# Patient Record
Sex: Female | Born: 1970 | Race: Black or African American | Hispanic: No | Smoking: Current every day smoker
Health system: Southern US, Community
[De-identification: ages and names within clinical notes are randomized; demographics above are authoritative.]

## PROBLEM LIST (undated history)

## (undated) HISTORY — PX: TUBAL LIGATION: SHX77

## (undated) HISTORY — PX: KNEE ARTHROSCOPY: SUR90

---

## 2009-05-22 HISTORY — PX: BREAST EXCISIONAL BIOPSY: SUR124

## 2011-03-15 DIAGNOSIS — Z6841 Body Mass Index (BMI) 40.0 and over, adult: Secondary | ICD-10-CM | POA: Insufficient documentation

## 2017-04-19 DIAGNOSIS — I10 Essential (primary) hypertension: Secondary | ICD-10-CM | POA: Insufficient documentation

## 2018-12-09 ENCOUNTER — Encounter (HOSPITAL_COMMUNITY): Payer: Self-pay | Admitting: Emergency Medicine

## 2018-12-09 ENCOUNTER — Other Ambulatory Visit: Payer: Self-pay

## 2018-12-09 ENCOUNTER — Emergency Department (HOSPITAL_COMMUNITY)
Admission: EM | Admit: 2018-12-09 | Discharge: 2018-12-09 | Disposition: A | Discharge status: Home / Self Care | Payer: Self-pay | Attending: Emergency Medicine | Admitting: Emergency Medicine

## 2018-12-09 DIAGNOSIS — L03213 Periorbital cellulitis: Secondary | ICD-10-CM

## 2018-12-09 DIAGNOSIS — H5789 Other specified disorders of eye and adnexa: Secondary | ICD-10-CM

## 2018-12-09 DIAGNOSIS — Z3202 Encounter for pregnancy test, result negative: Secondary | ICD-10-CM | POA: Insufficient documentation

## 2018-12-09 LAB — POC URINE PREG, ED: Preg Test, Ur: NEGATIVE

## 2018-12-09 MED ORDER — DOXYCYCLINE HYCLATE 100 MG PO CAPS: 100.0000 mg | ORAL_CAPSULE | Freq: Two times a day (BID) | ORAL | 0 refills | Status: AC

## 2018-12-09 MED ORDER — DOXYCYCLINE HYCLATE 100 MG PO TABS
100.0000 mg | ORAL_TABLET | Freq: Once | ORAL | Status: AC
  Administered 2018-12-09: 100 mg via ORAL
  Filled 2018-12-09: qty 1

## 2018-12-09 NOTE — Discharge Instructions (Addendum)
Please see the information and instructions below regarding your visit.  Your diagnoses today include:  1. Cyst of eye   2. Periorbital cellulitis of right eye     Tests performed today include: See side panel of your discharge paperwork for testing performed today. Vital signs are listed at the bottom of these instructions.   Medications prescribed:    Take any prescribed medications only as prescribed, and any over the counter medications only as directed on the packaging.  Doxycycline is an antibiotic that fights infection in the skin. This medication can make your skin sensitive to the sun, so please ensure that you wear sunscreen, hats, or other coverage over your skin while taking this. This medicine CANNOT be taken by women while pregnant, breastfeeding, or trying to become pregnant.  Please speak with a healthcare provider if any of these situations apply to you.  Home care instructions:  Please follow any educational materials contained in this packet.   Follow-up instructions: Please follow-up with Dr. Katy Fitch on Wednesday of this week.   Return instructions:  Please return to the Emergency Department if you experience worsening symptoms.  Come back to the emergency department for any pain behind the eye, inability to move the eye, fevers or worsening swelling or pain. Please return if you have any other emergent concerns.  Additional Information:   Your vital signs today were: BP 128/78 (BP Location: Right Wrist)    Pulse 91    Temp 99 F (37.2 C) (Oral)    Resp 16    Ht 5\' 4"  (1.626 m)    Wt 110.7 kg    SpO2 96%    BMI 41.88 kg/m  If your blood pressure (BP) was elevated on multiple readings during this visit above 130 for the top number or above 80 for the bottom number, please have this repeated by your primary care provider within one month. --------------  Thank you for allowing [place] to participate in your care today.

## 2018-12-09 NOTE — ED Provider Notes (Signed)
[facility] Provider Note   CSN: [id number] Arrival date & time: 12/09/18  [id number]     History   Chief Complaint Chief Complaint  Patient presents with  . Abscess    HPI Kristen Cummings is a 48 y.o. female.     HPI  Patient is a 48 yo female with no significant PMH presenting for erythema and possible abscess of right eye. Patient reports approximately 5 month history of lesion superior to the right lacrimal apparatus. Patient reports that over the past one week she has had increasing swelling of this lesion. No active drainage of the site. She reports increasing erythema of the right upper eyelid. She denies pain with extraocular motion, retro-orbital pain, fever, or chills. She denies remedies for symptoms.   History reviewed. No pertinent past medical history.  There are no active problems to display for this patient.    OB History   No obstetric history on file.      Home Medications    Prior to Admission medications   Not on File    Family History History reviewed. No pertinent family history.  Social History Social History   Tobacco Use  . Smoking status: Not on file  Substance Use Topics  . Alcohol use: Not on file  . Drug use: Not on file     Allergies   Patient has no known allergies.   Review of Systems Review of Systems  Constitutional: Negative for chills and fever.  HENT: Negative for congestion and rhinorrhea.   Eyes: Negative for photophobia, pain, discharge, redness, itching and visual disturbance.       +Periorbital swelling. +Cyst      Physical Exam Updated Vital Signs BP 128/78 (BP Location: Right Wrist)   Pulse 91   Temp 99 F (37.2 C) (Oral)   Resp 16   Ht 5\' 4"  (1.626 m)   Wt 110.7 kg   SpO2 96%   BMI 41.88 kg/m   Physical Exam Vitals signs and nursing note reviewed.  Constitutional:      General: She is not in acute distress.    Appearance: She is well-developed. She is  not diaphoretic.     Comments: Sitting comfortably in bed.  HENT:     Head: Normocephalic and atraumatic.  Eyes:     General:        Right eye: No discharge.        Left eye: No discharge.     Extraocular Movements: Extraocular movements intact.     Conjunctiva/sclera: Conjunctivae normal.     Pupils: Pupils are equal, round, and reactive to light.     Comments: Right eye exam: No proptosis. No pain with extraocular motions.  Mild supraorbital tissue swelling. See clinical photo for details. There is a firm, slightly erythematous cyst superior to lacrimal apparatus with no fluctuance or drainage.   Neck:     Musculoskeletal: Normal range of motion.  Cardiovascular:     Rate and Rhythm: Normal rate and regular rhythm.     Comments: Intact, 2+ radial pulse. Pulmonary:     Comments: Converses comfortably. No audible wheeze or stridor.  Abdominal:     General: There is no distension.  Musculoskeletal: Normal range of motion.  Skin:    General: Skin is warm and dry.  Neurological:     Mental Status: She is alert.     Comments: Cranial nerves intact to gross observation. Patient moves extremities without difficulty.  Psychiatric:  Behavior: Behavior normal.        Thought Content: Thought content normal.        Judgment: Judgment normal.        ED Treatments / Results  Labs (all labs ordered are listed, but only abnormal results are displayed) Labs Reviewed - No data to display  EKG None  Radiology No results found.  Procedures Procedures (including critical care time)  Medications Ordered in ED Medications - No data to display   Initial Impression / Assessment and Plan / ED Course  I have reviewed the triage vital signs and the nursing notes.  Pertinent labs & imaging results that were available during my care of the patient were reviewed by me and considered in my medical decision making (see chart for details).        This is a well appearing 48 yo  female with no active medical problems presenting for lesion and swelling of right periorbital region. Appears that patient may have a sebaceous cyst that has become infected. Not clearly an abscess. No evidence of proptosis, pain with EOMs, or signs or symptoms of post-septal cellulitis. Case discussed with Dr. Katy Fitch of ophthalmology who recommends antibiotics and follow up with him in 48 hours. States that is does not appear to require I & D. Recommends doxycycline. Appreciate his involvement. Patient is given return precautions for any increasing swelling, erythema, pain with EOMs, or fever or chills. Patient is in understanding and agrees with the plan of care.  Final Clinical Impressions(s) / ED Diagnoses   Final diagnoses:  Cyst of eye  Periorbital cellulitis of right eye    ED Discharge Orders         Ordered    doxycycline (VIBRAMYCIN) 100 MG capsule  2 times daily     12/09/18 1245           Albesa Seen, PA-C 12/09/18 [place], [place], DO 12/09/18 1839

## 2018-12-09 NOTE — ED Triage Notes (Signed)
Pt arrives to ED from home with complaints of an abscess above her right eye. Pt states it has been there for a couple of months but has became infected over the last two weeks.

## 2018-12-09 NOTE — ED Notes (Signed)
Patient verbalizes understanding of discharge instructions. Opportunity for questioning and answers were provided. Armband removed by staff, pt discharged from ED.  

## 2019-01-10 ENCOUNTER — Other Ambulatory Visit: Payer: Self-pay | Admitting: *Deleted

## 2019-01-10 DIAGNOSIS — Z20822 Contact with and (suspected) exposure to covid-19: Secondary | ICD-10-CM

## 2019-01-11 LAB — NOVEL CORONAVIRUS, NAA: SARS-CoV-2, NAA: NOT DETECTED

## 2019-05-13 ENCOUNTER — Other Ambulatory Visit: Payer: Self-pay

## 2019-06-20 ENCOUNTER — Ambulatory Visit: Payer: Self-pay | Attending: Internal Medicine

## 2019-06-20 DIAGNOSIS — U071 COVID-19: Secondary | ICD-10-CM | POA: Insufficient documentation

## 2019-06-20 DIAGNOSIS — Z20822 Contact with and (suspected) exposure to covid-19: Secondary | ICD-10-CM

## 2019-06-21 LAB — NOVEL CORONAVIRUS, NAA: SARS-CoV-2, NAA: DETECTED — AB

## 2019-06-22 ENCOUNTER — Telehealth: Payer: Self-pay | Admitting: Infectious Diseases

## 2019-06-22 ENCOUNTER — Encounter: Payer: Self-pay | Admitting: Infectious Diseases

## 2019-06-22 ENCOUNTER — Other Ambulatory Visit: Payer: Self-pay | Admitting: Infectious Diseases

## 2019-06-22 DIAGNOSIS — Z6841 Body Mass Index (BMI) 40.0 and over, adult: Secondary | ICD-10-CM

## 2019-06-22 DIAGNOSIS — I10 Essential (primary) hypertension: Secondary | ICD-10-CM

## 2019-06-22 DIAGNOSIS — U071 COVID-19: Secondary | ICD-10-CM

## 2019-06-22 NOTE — Progress Notes (Signed)
  I connected by phone with Kristen Cummings on 06/22/2019 at 11:01 AM to discuss the potential use of an new treatment for mild to moderate COVID-19 viral infection in non-hospitalized patients.  This patient is a 49 y.o. female that meets the FDA criteria for Emergency Use Authorization of bamlanivimab or casirivimab\imdevimab.  Has a (+) direct SARS-CoV-2 viral test result  Has mild or moderate COVID-19   Is ? 51 years of age and weighs ? 40 kg  Is NOT hospitalized due to COVID-19  Is NOT requiring oxygen therapy or requiring an increase in baseline oxygen flow rate due to COVID-19  Is within 10 days of symptom onset  Has at least one of the high risk factor(s) for progression to severe COVID-19 and/or hospitalization as defined in EUA.  Specific high risk criteria : BMI >/= 35   I have spoken and communicated the following to the patient or parent/caregiver:  1. FDA has authorized the emergency use of bamlanivimab and casirivimab\imdevimab for the treatment of mild to moderate COVID-19 in adults and pediatric patients with positive results of direct SARS-CoV-2 viral testing who are 59 years of age and older weighing at least 40 kg, and who are at high risk for progressing to severe COVID-19 and/or hospitalization.  2. The significant known and potential risks and benefits of bamlanivimab and casirivimab\imdevimab, and the extent to which such potential risks and benefits are unknown.  3. Information on available alternative treatments and the risks and benefits of those alternatives, including clinical trials.  4. Patients treated with bamlanivimab and casirivimab\imdevimab should continue to self-isolate and use infection control measures (e.g., wear mask, isolate, social distance, avoid sharing personal items, clean and disinfect "high touch" surfaces, and frequent handwashing) according to CDC guidelines.   5. The patient or parent/caregiver has the option to accept or refuse  bamlanivimab or casirivimab\imdevimab .  After reviewing this information with the patient, The patient agreed to proceed with receiving the casirivimab\imdevimab infusion and will be provided a copy of the Fact sheet prior to receiving the infusion.[name] 06/22/2019 11:01 AM

## 2019-06-22 NOTE — Telephone Encounter (Signed)
Called to discuss with patient about Covid symptoms and the use of bamlanivimab, a monoclonal antibody infusion for those with mild to moderate Covid symptoms and at a high risk of hospitalization.  Pt is qualified for this infusion at the [facility] infusion center due to BMI>35.    She is having some dizziness and loopy at times with some headaches. She feels the cough is coming more from her chest. Also having some fatigue. She has been using Mucinex regularly. She takes naproxen for her knees daily. Asking about ASA due to clotting problems she has heard about after COVID. Informed it is not well defined as to who should take this and if so for how long in the outpatient setting. Would encourage her that if she decides to take this she use enteric coated and take with food.   She started feeling poorly Saturday 1/23 when she started feeling poorly. Tested on Monday 1/25 (send out test) that was negative. Lost smell on Thursday and Friday and re-tested positive on 1/29.   She is treated for HTN, knee arthritis, hyperlipidemia, vitamin D. She also has a BMI of 41.   Scheduled Tuesday 2/1 on day sx day 10. She has received the 1st COVID vaccine and scheduled to get the second 2/5 - discussed recommendations to delay 90d after mAb infusion.

## 2019-06-24 ENCOUNTER — Ambulatory Visit (HOSPITAL_COMMUNITY)
Admission: RE | Admit: 2019-06-24 | Discharge: 2019-06-24 | Disposition: A | Discharge status: Home / Self Care | Payer: HRSA Program | Source: Ambulatory Visit | Attending: Pulmonary Disease | Admitting: Pulmonary Disease

## 2019-06-24 DIAGNOSIS — Z6841 Body Mass Index (BMI) 40.0 and over, adult: Secondary | ICD-10-CM | POA: Diagnosis not present

## 2019-06-24 DIAGNOSIS — U071 COVID-19: Secondary | ICD-10-CM | POA: Insufficient documentation

## 2019-06-24 MED ORDER — EPINEPHRINE 0.3 MG/0.3ML IJ SOAJ: 0.3000 mg | Freq: Once | INTRAMUSCULAR | Status: DC | PRN

## 2019-06-24 MED ORDER — SODIUM CHLORIDE 0.9 % IV SOLN
Freq: Once | INTRAVENOUS | Status: AC
  Filled 2019-06-24: qty 10

## 2019-06-24 MED ORDER — METHYLPREDNISOLONE SODIUM SUCC 125 MG IJ SOLR: 125.0000 mg | Freq: Once | INTRAMUSCULAR | Status: DC | PRN

## 2019-06-24 MED ORDER — DIPHENHYDRAMINE HCL 50 MG/ML IJ SOLN: 50.0000 mg | Freq: Once | INTRAMUSCULAR | Status: DC | PRN

## 2019-06-24 MED ORDER — FAMOTIDINE IN NACL 20-0.9 MG/50ML-% IV SOLN: 20.0000 mg | Freq: Once | INTRAVENOUS | Status: DC | PRN

## 2019-06-24 MED ORDER — ALBUTEROL SULFATE HFA 108 (90 BASE) MCG/ACT IN AERS: 2.0000 | INHALATION_SPRAY | Freq: Once | RESPIRATORY_TRACT | Status: DC | PRN

## 2019-06-24 MED ORDER — SODIUM CHLORIDE 0.9 % IV SOLN: INTRAVENOUS | Status: DC | PRN

## 2019-06-24 NOTE — Progress Notes (Signed)
  Diagnosis: COVID-19  Physician: Dr. Wright  Procedure: Covid Infusion Clinic Med: casirivimab\imdevimab infusion - Provided patient with casirivimab\imdevimab fact sheet for patients, parents and caregivers prior to infusion.  Complications: No immediate complications noted.  Discharge: Discharged home   Kristen Cummings 06/24/2019    

## 2019-06-24 NOTE — Discharge Instructions (Signed)

## 2020-03-29 ENCOUNTER — Other Ambulatory Visit: Payer: Self-pay

## 2020-09-23 ENCOUNTER — Encounter: Payer: Self-pay | Admitting: Internal Medicine

## 2020-09-23 ENCOUNTER — Ambulatory Visit: Payer: 59 | Attending: Internal Medicine | Admitting: Internal Medicine

## 2020-09-23 ENCOUNTER — Other Ambulatory Visit: Payer: Self-pay

## 2020-09-23 VITALS — BP 118/77 | HR 98 | Resp 16 | Ht 63.5 in | Wt 256.0 lb

## 2020-09-23 DIAGNOSIS — E785 Hyperlipidemia, unspecified: Secondary | ICD-10-CM

## 2020-09-23 DIAGNOSIS — N950 Postmenopausal bleeding: Secondary | ICD-10-CM

## 2020-09-23 DIAGNOSIS — L659 Nonscarring hair loss, unspecified: Secondary | ICD-10-CM

## 2020-09-23 DIAGNOSIS — F17200 Nicotine dependence, unspecified, uncomplicated: Secondary | ICD-10-CM | POA: Insufficient documentation

## 2020-09-23 DIAGNOSIS — M79671 Pain in right foot: Secondary | ICD-10-CM | POA: Diagnosis not present

## 2020-09-23 DIAGNOSIS — I10 Essential (primary) hypertension: Secondary | ICD-10-CM

## 2020-09-23 DIAGNOSIS — Z1231 Encounter for screening mammogram for malignant neoplasm of breast: Secondary | ICD-10-CM

## 2020-09-23 DIAGNOSIS — M170 Bilateral primary osteoarthritis of knee: Secondary | ICD-10-CM | POA: Diagnosis not present

## 2020-09-23 DIAGNOSIS — F321 Major depressive disorder, single episode, moderate: Secondary | ICD-10-CM | POA: Diagnosis not present

## 2020-09-23 MED ORDER — NAPROXEN 500 MG PO TABS: 500.0000 mg | ORAL_TABLET | Freq: Two times a day (BID) | ORAL | 2 refills | Status: DC

## 2020-09-23 MED ORDER — CYCLOBENZAPRINE HCL 10 MG PO TABS: 10.0000 mg | ORAL_TABLET | Freq: Two times a day (BID) | ORAL | 2 refills | Status: DC | PRN

## 2020-09-23 MED ORDER — ROSUVASTATIN CALCIUM 40 MG PO TABS: 40.0000 mg | ORAL_TABLET | Freq: Every day | ORAL | 11 refills | Status: DC

## 2020-09-23 MED ORDER — LISINOPRIL-HYDROCHLOROTHIAZIDE 20-25 MG PO TABS: 1.0000 | ORAL_TABLET | Freq: Every day | ORAL | 3 refills | Status: DC

## 2020-09-23 MED ORDER — AMLODIPINE BESYLATE 10 MG PO TABS: 10.0000 mg | ORAL_TABLET | Freq: Every day | ORAL | 11 refills | Status: DC

## 2020-09-23 MED ORDER — DULOXETINE HCL 60 MG PO CPEP: 60.0000 mg | ORAL_CAPSULE | Freq: Every day | ORAL | 5 refills | Status: DC

## 2020-09-23 NOTE — Progress Notes (Signed)
Patient ID: Kristen Cummings, female    DOB: March 06, 1971  MRN: [id number]  CC: New Patient (Initial Visit)   Subjective: Chealsea Cummings is a 50 y.o. female who presents for new pt visit Her concerns today include:  Hx of HTN, HL, tob dep, obesity, preDM, OA knees (on Naprosyn and Cymbalta), insomnia  Patient presents for new patient visit.  Previous PCP was at [facility] in [place].  She confirms medical history of HTN, HL, tobacco dependence, obesity, OA of the knees and insomnia.  She is requesting refills on her medications.  OA knees: has advanced arthritis in both knees.   -Saw an orthopedics in the past and was told that she was too young to have knee replacement surgery.   -had arthroscopic surgery on the left knee in 2014.   -ambulates with a cane.  - Has chronic pain for which she takes Cymbalta and Naprosyn. Helpful.  Brings form with her today for renewal of handicap sticker. -Requests refill on cyclobenzaprine which she takes for back spasms.  -She is obese.  She has seen nutritionist in the past and tries to work on her eating habits.  Not able to move as much as she would like due to chronic pain in the knees from arthritis -Thinks she may have a thyroid issue because weight fluctuates between 230 and 250s, fatigue and hair loss.  Started taking an over-the-counter thyroid supplement.  Has bottle with her today.  Ingredients in the supplement contains several of the B vitamins, magnesium, zinc, selenium and copper.  Increased energy since taking this supplement.  Told by previous primary physician when she lived in [place] that thyroid level was borderline.  HTN: On lisinopril/hydrochlorothiazide and Norvasc.  Compliant with medications.  Blood pressure stable.  Limit salt in food.  HL: She is on Crestor 40 mg daily.  Right heel pain: X3 weeks.  Thinks that she has plantar fasciitis which she had in the left heel before.  Reports  swelling around the posterior heel and pain on plantar heel that is worse when she first wakes up in the mornings.  She has been icing and massaging the heel.  Complains of feeling very depressed for several months.  Contributing factors are limited mobility and chronic pain due to OA of the knees.  Requests referral to therapist.  Has had suicidal thoughts but none in the last 2 weeks.  No current plans in place to hurt herself.  Had suicide attempt with overdose at the age of 48.  tob dep: Smoked for 19 years.  Trying to quit.  Currently at 4 cigarettes a day.  Quit in the past the longest of which was 3 months.  Has tried nicotine patches and gum which caused worsening insomnia.  Chantix caused bad dreams.    Wants to know whether she is menopausal or not for her age.  Regular menses stopped in 2017.  However about once or twice a year she has spotting that can last about half a day.  Saw GYN at [facility] for postmenopausal bleeding 05/2019.  Negative EMB.  Pelvic ultrasound revealed fibroids.  -Currently in a study through [facility] where she is screened for STDs about every 6 months.  HM: Last MMG was 1 yr ago.  Last Pap +HPV 04/03/2019 at [facility] negative.   Past medical, social, family history and surgical history reviewed. Patient Active Problem List   Diagnosis Date Noted  . Benign essential hypertension 04/19/2017  . BMI  40.0-44.9, adult (HCC) 03/15/2011     Current Outpatient Medications on File Prior to Visit  Medication Sig Dispense Refill  . amLODipine (NORVASC) 10 MG tablet Take 10 mg by mouth daily.    . Cholecalciferol (VITAMIN D3) 25 MCG (1000 UT) CAPS Take by mouth.    . cyclobenzaprine (FLEXERIL) 10 MG tablet Take 10 mg by mouth 3 (three) times daily as needed for muscle spasms.    . DULoxetine (CYMBALTA) 60 MG capsule Take 60 mg by mouth daily.    Marland Kitchen lisinopril-hydrochlorothiazide (ZESTORETIC) 20-25 MG tablet Take 1 tablet by mouth daily.    . Melatonin 10 MG TABS Take by mouth.    .  naproxen (NAPROSYN) 500 MG tablet Take 500 mg by mouth 2 (two) times daily with a meal.    . rosuvastatin (CRESTOR) 40 MG tablet Take 40 mg by mouth daily.     No current facility-administered medications on file prior to visit.    No Known Allergies  Social History   Socioeconomic History  . Marital status: Divorced    Spouse name: Not on file  . Number of children: 3  . Years of education: Not on file  . Highest education level: Associate degree: academic program  Occupational History  . Occupation: Safe transport  Tobacco Use  . Smoking status: Current Every Day Smoker    Years: 19.00    Types: Cigarettes  . Smokeless tobacco: Never Used  Vaping Use  . Vaping Use: Never used  Substance and Sexual Activity  . Alcohol use: Yes    Comment: once every 2 wks  . Drug use: Never  . Sexual activity: Not on file    Comment: tubal  Other Topics Concern  . Not on file  Social History Narrative  . Not on file   Social Determinants of Health   Financial Resource Strain: Not on file  Food Insecurity: Not on file  Transportation Needs: Not on file  Physical Activity: Not on file  Stress: Not on file  Social Connections: Not on file  Intimate Partner Violence: Not on file    Family History  Problem Relation Age of Onset  . Heart disease Father   . Breast cancer Sister   . Breast cancer Paternal Aunt   . Breast cancer Paternal Grandmother     ROS: Review of Systems Negative except as stated above  PHYSICAL EXAM: BP 118/77   Pulse 98   Resp 16   Ht 5' 3.5" (1.613 m)   Wt 256 lb (116.1 kg)   SpO2 95%   BMI 44.64 kg/m   Physical Exam  General appearance - alert, well appearing, and in no distress Mental status - normal mood, behavior, speech, dress, motor activity, and thought processes Eyes - pupils equal and reactive, extraocular eye movements intact Neck - supple, no significant adenopathy.  No thyroid enlargement.  No thyroid nodules appreciated. Chest -  clear to auscultation, no wheezes, rales or rhonchi, symmetric air entry Heart - normal rate, regular rhythm, normal S1, S2, no murmurs, rubs, clicks or gallops Musculoskeletal -knees: She has mild bowing of the knees.  Large body habitus.  No point tenderness.  Moderate discomfort with attempted passive range of motion of the right knee.  Good range of motion of the left knee.  She ambulates with a cane. Extremities -lower extremity edema Hair: Cut short.  Mild thinning noted in the temples. Right foot: Mild tenderness on palpation of the plantar heel.  No swelling noted of  the ankle. Depression screen PHQ 2/9 09/23/2020  Decreased Interest 2  Down, Depressed, Hopeless 2  PHQ - 2 Score 4  Altered sleeping 1  Tired, decreased energy 2  Change in appetite 0  Feeling bad or failure about yourself  0  Trouble concentrating 2  Moving slowly or fidgety/restless 0  Suicidal thoughts 2  PHQ-9 Score 11   GAD 7 : Generalized Anxiety Score 09/23/2020  Nervous, Anxious, on Edge 2  Control/stop worrying 2  Worry too much - different things 2  Trouble relaxing 2  Restless 0  Easily annoyed or irritable 3  Afraid - awful might happen 2  Total GAD 7 Score 13      Depression screen PHQ 2/9 09/23/2020  Decreased Interest 2  Down, Depressed, Hopeless 2  PHQ - 2 Score 4  Altered sleeping 1  Tired, decreased energy 2  Change in appetite 0  Feeling bad or failure about yourself  0  Trouble concentrating 2  Moving slowly or fidgety/restless 0  Suicidal thoughts 2  PHQ-9 Score 11     ASSESSMENT AND PLAN: 1. Essential hypertension At goal.  Continue current medications and low-salt diet - CBC - Comprehensive metabolic panel  2. Primary osteoarthritis of both knees Discussed the importance of weight loss to help take mechanical strain of the knee joints.  Continue Cymbalta and Naprosyn. - Ambulatory referral to Orthopedic Surgery  3. Pain of right heel Recommend x-ray to rule out bone  spur.  Alternate heat and cold compresses.  Stretching heel exercises demonstrated and encouraged. - DG Foot Complete Right; Future - Ambulatory referral to Podiatry  4. Major depressive disorder, single episode, moderate (HCC) Discussed her chronic pain and depression can be a vicious cycle.  Discussed management of depression.  She is currently on Cymbalta.  I will refer to behavioral health.  She wishes only to see a counselor and not a psychiatrist.  If suicidal thoughts increase she should give [place] a call. - Ambulatory referral to Psychiatry  5. Tobacco dependence Advised to quit.  Discussed health risks associated with smoking.  Patient wanting to quit and ready to do so.  We discussed methods to help her quit including medications, cold [place] or try to wean herself.  She thinks the latter would be the most effective way for her as she has been weaning herself and is down to 4 cigarettes a day.  Set up plan to try to cut back by 1 cigarette every 2 weeks.  Encouraged her to set a quit date.  We will reassess her progress on subsequent visit.  3 minutes spent on counseling.  6. Postmenopausal bleeding This has been evaluated fully by gynecology at [facility] in 2021 with negative endometrial biopsy and pelvic ultrasound.  She declines referral to GYN at this time.  No recent episodes of spotting.  7. Hair loss - TSH+T4F+T3Free  8. Obesity, morbid, BMI 40.0-49.9 (HCC) Healthy eating habits encouraged.  She is not able to be as active as she would like due to arthritis pain.  Discussed with her our weight management program and patient is interested in pursuing this.  Referral submitted - Amb Ref to Medical Weight Management  9. Hyperlipidemia, unspecified hyperlipidemia type Continue Crestor - Lipid panel  10. Encounter for screening mammogram for malignant neoplasm of breast - MM Digital Screening; Future    Patient was given the opportunity to ask questions.  Patient verbalized  understanding of the plan and was able to repeat key elements of  the plan.   Orders Placed This Encounter  Procedures  . DG Foot Complete Right  . MM Digital Screening  . TSH+T4F+T3Free  . CBC  . Comprehensive metabolic panel  . Lipid panel  . Ambulatory referral to Podiatry  . Ambulatory referral to Orthopedic Surgery  . Ambulatory referral to Psychiatry  . Amb Ref to Medical Weight Management     Requested Prescriptions    No prescriptions requested or ordered in this encounter    Return in about 10 weeks (around 12/02/2020).  Jonah Blueeborah Lonnie Reth, MD, FACP

## 2020-09-23 NOTE — Progress Notes (Signed)
Pt is taking a Thyroid supplement

## 2020-09-23 NOTE — Patient Instructions (Addendum)
I have referred you to orthopedics and the podiatrist. I have referred you to behavioral health for counseling.  Try setting a quit date to stop smoking and then work towards weaning himself down for that date as we discussed today.

## 2020-09-24 LAB — COMPREHENSIVE METABOLIC PANEL
ALT: 21 IU/L (ref 0–32)
AST: 18 IU/L (ref 0–40)
Albumin/Globulin Ratio: 1.9 (ref 1.2–2.2)
Albumin: 5 g/dL — ABNORMAL HIGH (ref 3.8–4.9)
Alkaline Phosphatase: 117 IU/L (ref 44–121)
BUN/Creatinine Ratio: 18 (ref 9–23)
BUN: 18 mg/dL (ref 6–24)
Bilirubin Total: 0.3 mg/dL (ref 0.0–1.2)
CO2: 25 mmol/L (ref 20–29)
Calcium: 10.2 mg/dL (ref 8.7–10.2)
Chloride: 99 mmol/L (ref 96–106)
Creatinine, Ser: 0.98 mg/dL (ref 0.57–1.00)
Globulin, Total: 2.7 g/dL (ref 1.5–4.5)
Glucose: 112 mg/dL — ABNORMAL HIGH (ref 65–99)
Potassium: 4.2 mmol/L (ref 3.5–5.2)
Sodium: 140 mmol/L (ref 134–144)
Total Protein: 7.7 g/dL (ref 6.0–8.5)
eGFR: 70 mL/min/{1.73_m2} (ref >59)

## 2020-09-24 LAB — CBC
Hematocrit: 37.9 % (ref 34.0–46.6)
Hemoglobin: 12.6 g/dL (ref 11.1–15.9)
MCH: 31.6 pg (ref 26.6–33.0)
MCHC: 33.2 g/dL (ref 31.5–35.7)
MCV: 95 fL (ref 79–97)
Platelets: 424 10*3/uL (ref 150–450)
RBC: 3.99 x10E6/uL (ref 3.77–5.28)
RDW: 11.4 % — ABNORMAL LOW (ref 11.7–15.4)
WBC: 9.2 10*3/uL (ref 3.4–10.8)

## 2020-09-24 LAB — TSH+T4F+T3FREE
Free T4: 1.12 ng/dL (ref 0.82–1.77)
T3, Free: 2.9 pg/mL (ref 2.0–4.4)
TSH: 0.794 u[IU]/mL (ref 0.450–4.500)

## 2020-09-24 LAB — LIPID PANEL
Chol/HDL Ratio: 4.1 ratio (ref 0.0–4.4)
Cholesterol, Total: 196 mg/dL (ref 100–199)
HDL: 48 mg/dL (ref >39)
LDL Chol Calc (NIH): 111 mg/dL — ABNORMAL HIGH (ref 0–99)
Triglycerides: 215 mg/dL — ABNORMAL HIGH (ref 0–149)
VLDL Cholesterol Cal: 37 mg/dL (ref 5–40)

## 2020-10-14 ENCOUNTER — Ambulatory Visit: Payer: 59 | Admitting: Orthopedic Surgery

## 2020-10-14 ENCOUNTER — Ambulatory Visit: Payer: 59 | Admitting: Podiatry

## 2020-10-21 ENCOUNTER — Encounter: Payer: Self-pay | Admitting: Orthopedic Surgery

## 2020-10-21 ENCOUNTER — Ambulatory Visit (INDEPENDENT_AMBULATORY_CARE_PROVIDER_SITE_OTHER): Payer: 59

## 2020-10-21 ENCOUNTER — Encounter: Payer: Self-pay | Admitting: Podiatry

## 2020-10-21 ENCOUNTER — Other Ambulatory Visit: Payer: Self-pay

## 2020-10-21 ENCOUNTER — Ambulatory Visit (INDEPENDENT_AMBULATORY_CARE_PROVIDER_SITE_OTHER): Payer: 59 | Admitting: Podiatry

## 2020-10-21 ENCOUNTER — Ambulatory Visit (INDEPENDENT_AMBULATORY_CARE_PROVIDER_SITE_OTHER): Payer: 59 | Admitting: Orthopedic Surgery

## 2020-10-21 DIAGNOSIS — M7661 Achilles tendinitis, right leg: Secondary | ICD-10-CM

## 2020-10-21 DIAGNOSIS — M170 Bilateral primary osteoarthritis of knee: Secondary | ICD-10-CM

## 2020-10-21 DIAGNOSIS — M79671 Pain in right foot: Secondary | ICD-10-CM | POA: Diagnosis not present

## 2020-10-21 DIAGNOSIS — M25561 Pain in right knee: Secondary | ICD-10-CM

## 2020-10-21 DIAGNOSIS — M722 Plantar fascial fibromatosis: Secondary | ICD-10-CM

## 2020-10-21 DIAGNOSIS — M76821 Posterior tibial tendinitis, right leg: Secondary | ICD-10-CM

## 2020-10-21 MED ORDER — TRIAMCINOLONE ACETONIDE 10 MG/ML IJ SUSP
10.0000 mg | Freq: Once | INTRAMUSCULAR | Status: AC
  Administered 2020-10-21: 10 mg

## 2020-10-21 NOTE — Progress Notes (Signed)
Subjective:   Patient ID: Kristen Cummings, female   DOB: 50 y.o.   MRN: [id number]   HPI Patient presents stating she is having pain in the back of her heel and its been present for least a month she has knee problems is moderately obese does not remember injury.  Patient smokes occasionally and would like to be more active   Review of Systems  All other systems reviewed and are negative.       Objective:  Physical Exam Vitals and nursing note reviewed.  Constitutional:      Appearance: She is well-developed.  Pulmonary:     Effort: Pulmonary effort is normal.  Musculoskeletal:        General: Normal range of motion.  Skin:    General: Skin is warm.  Neurological:     Mental Status: She is alert.     Neurovascular status intact muscle strength adequate range of motion adequate with patient found to have an discomfort in the posterior aspect of the right heel mostly on the medial side with inflammation fluid buildup.  Patient has moderate equinus does have obesity and has knee issues which may be causing change in gait with good digital perfusion noted     Assessment:  Achilles tendinitis right medial side no current center and lateral involvement with fluid buildup     Plan:  Acute Achilles tendinitis right inflammation fluid medial side.  Did H&P today and x-rays educated patient on deformity and discussed careful medial injection explaining the chances for rupture associated with this.  Patient wants to proceed and I did a sterile prep of the medial side injected 3 mg dexamethasone 3 mg Kenalog 5 mg Xylocaine advised on ice therapy reduced activity and stretching exercises which she may begin in a few days.  Reappoint 3 weeks to recheck  X-rays indicate minimal spur formation no indications of stress fracture or active pathology

## 2020-10-21 NOTE — Progress Notes (Signed)
Office Visit Note   Patient: Kristen Cummings           Date of Birth: 1970-08-21           MRN: [id number] Visit Date: 10/21/2020 Requested by: Marcine Matar, MD [address] [place],  [place] [address] PCP: Marcine Matar, MD  Subjective: Chief Complaint  Patient presents with  . Right Knee - Pain    HPI: Kristen Cummings is a 50 y.o. female who presents to the office complaining of right knee pain and right foot pain.  Patient complains of long history of right knee pain that has been worse recently.  Denies any recent injury.  No history of surgery to the right knee.  Does have history of left knee arthroscopy back in 2014 but left knee is not currently causing her any symptoms.  Complains of occasional giving way of the knee but no frank instability that is recurring.  She has occasional popping but no locking of the knee.  She wakes on occasion with pain from the knee.  Pain has been worse since January 2022.  She is currently losing weight and is down to 248 pounds from 270 pounds.  No history of diabetes.  She is taking duloxetine and naproxen to help control her knee pain.  Denies any groin pain or radicular pain down her leg.  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  She would like to try an injection for the right knee.  Regarding her right foot, she has had increased pain in the foot over the last month without any injury.  Localizes most of her pain to the medial aspect of the heel.  She feels it is similar to pain from Planter fasciitis she has had in the past on the left foot.  Left foot pain resolved spontaneously several years ago.  Nuys any history of surgery to the right foot..                ROS: All systems reviewed are negative as they relate to the chief complaint within the history of present illness.  Patient denies fevers or chills.  Assessment & Plan: Visit Diagnoses:  1. Bilateral primary osteoarthritis of knee   2. Right knee pain, unspecified  chronicity   3. Pain in right foot   4. Posterior tibial tendon dysfunction (PTTD) of right lower extremity     Plan: Patient is a 50 year old female who presents complaining of right knee and right foot pain.  Right knee pain is been bothering her for about 5 to 6 months with right foot pain worse in the last month.  Radiographs of the right knee and right foot were taken today.  She does have flexion contracture of both knees and significant degenerative changes in both knees but the left knee does not cause her any symptoms though it is much more degenerative on radiographs.  For the right knee, patient would like to try an injection which is reasonable.  Right knee injection administered today and patient tolerated the procedure well.  Regarding her right foot, though she may have a small component of Planter fasciitis given the tenderness and the plantar calcaneal spur noted on x-ray, impression is that most of her pain acutely is stemming from posterior tibial tendon dysfunction.  Most of her pain on exam is localized to the insertion of the posterior tib tendon and she has an asymmetric weakness with single-leg heel raise on the right.  Plan to  have patient try arch supports with follow-up in 4 weeks for clinical recheck.  She also has follow-up with Dr. Charlsie Merles in podiatry scheduled for 11/12/2020 for further evaluation of right heel pain.  Follow-Up Instructions: No follow-ups on file.   Orders:  Orders Placed This Encounter  Procedures  . XR KNEE 3 VIEW RIGHT  . XR Foot Complete Right   No orders of the defined types were placed in this encounter.     Procedures: Large Joint Inj: bilateral knee on 10/21/2020 7:43 AM Indications: diagnostic evaluation, joint swelling and pain Details: 18 G 1.5 in needle, superolateral approach  Arthrogram: No  Medications (Right): 5 mL lidocaine 1 %; 4 mL bupivacaine 0.25 %; 6 mg betamethasone acetate-betamethasone sodium phosphate 6 (3-3)  MG/ML Medications (Left): 5 mL lidocaine 1 %; 4 mL bupivacaine 0.25 %; 6 mg betamethasone acetate-betamethasone sodium phosphate 6 (3-3) MG/ML Outcome: tolerated well, no immediate complications Procedure, treatment alternatives, risks and benefits explained, specific risks discussed. Consent was given by the patient. Immediately prior to procedure a time out was called to verify the correct patient, procedure, equipment, support staff and site/side marked as required. Patient was prepped and draped in the usual sterile fashion.       Clinical Data: No additional findings.  Objective: Vital Signs: There were no vitals taken for this visit.  Physical Exam:  Constitutional: Patient appears well-developed HEENT:  Head: Normocephalic Eyes:EOM are normal Neck: Normal range of motion Cardiovascular: Normal rate Pulmonary/chest: Effort normal Neurologic: Patient is alert Skin: Skin is warm Psychiatric: Patient has normal mood and affect  Ortho Exam: Ortho exam demonstrates right knee with 10 degree flexion contracture and flexion to 95 degrees passively.  Left knee with 5 degree flexion contracture and flexion to 105 degrees passively.  Varus deformity noted bilaterally.  No calf tenderness.  Negative Homans' sign bilaterally.  Able to perform straight leg raise bilaterally.  Tenderness over the medial lateral joint lines of the right knee but no tenderness throughout the left knee.  No pain with hip range of motion.  1+ DP pulse of the right foot.  No tenderness over the medial or lateral malleolus.  No tenderness throughout the forefoot or dorsal midfoot but she does have tenderness over the calcaneal insertion of the plantar fascia that is rated as mild.  Moderate tenderness over the insertion of the posterior tibialis tendon.  Able to perform calf raises with both legs without significant difficulty.  Able to form single-leg heel raise on the left without difficulty.  Unable to perform  single-leg heel raise on the right.  Flatfoot deformity noted with loss of arch height on the right.  Intact dorsiflexion, plantarflexion, inversion, eversion.  Specialty Comments:  No specialty comments available.  Imaging: No results found.   PMFS History: Patient Active Problem List   Diagnosis Date Noted  . Primary osteoarthritis of both knees 09/23/2020  . Major depressive disorder, single episode, moderate (HCC) 09/23/2020  . Tobacco dependence 09/23/2020  . Hyperlipidemia 09/23/2020  . Postmenopausal bleeding 09/23/2020  . Essential hypertension 04/19/2017  . BMI 40.0-44.9, adult (HCC) 03/15/2011   No past medical history on file.  Family History  Problem Relation Age of Onset  . Heart disease Father   . Breast cancer Sister   . Breast cancer Paternal Aunt   . Breast cancer Paternal Grandmother     Past Surgical History:  Procedure Laterality Date  . KNEE ARTHROSCOPY    . TUBAL LIGATION     Social History  Occupational History  . Occupation: Safe transport  Tobacco Use  . Smoking status: Current Every Day Smoker    Years: 19.00    Types: Cigarettes  . Smokeless tobacco: Never Used  Vaping Use  . Vaping Use: Never used  Substance and Sexual Activity  . Alcohol use: Yes    Comment: once every 2 wks  . Drug use: Never  . Sexual activity: Not on file    Comment: tubal

## 2020-10-21 NOTE — Patient Instructions (Signed)

## 2020-10-22 ENCOUNTER — Encounter: Payer: Self-pay | Admitting: Orthopedic Surgery

## 2020-10-22 ENCOUNTER — Other Ambulatory Visit: Payer: Self-pay | Admitting: Podiatry

## 2020-10-22 DIAGNOSIS — M7661 Achilles tendinitis, right leg: Secondary | ICD-10-CM

## 2020-10-22 MED ORDER — LIDOCAINE HCL 1 % IJ SOLN
5.0000 mL | INTRAMUSCULAR | Status: AC | PRN
  Administered 2020-10-21: 5 mL

## 2020-10-22 MED ORDER — BETAMETHASONE SOD PHOS & ACET 6 (3-3) MG/ML IJ SUSP
6.0000 mg | INTRAMUSCULAR | Status: AC | PRN
  Administered 2020-10-21: 6 mg via INTRA_ARTICULAR

## 2020-10-22 MED ORDER — BUPIVACAINE HCL 0.25 % IJ SOLN
4.0000 mL | INTRAMUSCULAR | Status: AC | PRN
  Administered 2020-10-21: 4 mL via INTRA_ARTICULAR

## 2020-10-26 ENCOUNTER — Encounter (INDEPENDENT_AMBULATORY_CARE_PROVIDER_SITE_OTHER): Payer: Self-pay

## 2020-11-12 ENCOUNTER — Ambulatory Visit: Payer: 59 | Admitting: Podiatry

## 2020-11-17 ENCOUNTER — Ambulatory Visit (INDEPENDENT_AMBULATORY_CARE_PROVIDER_SITE_OTHER): Payer: 59 | Admitting: Licensed Clinical Social Worker

## 2020-11-17 DIAGNOSIS — F410 Panic disorder [episodic paroxysmal anxiety] without agoraphobia: Secondary | ICD-10-CM

## 2020-11-17 DIAGNOSIS — F331 Major depressive disorder, recurrent, moderate: Secondary | ICD-10-CM | POA: Diagnosis not present

## 2020-11-17 DIAGNOSIS — F411 Generalized anxiety disorder: Secondary | ICD-10-CM
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
## 2020-12-27 ENCOUNTER — Ambulatory Visit (HOSPITAL_COMMUNITY): Payer: 59

## 2021-01-14 ENCOUNTER — Ambulatory Visit (HOSPITAL_COMMUNITY): Payer: 59 | Admitting: Licensed Clinical Social Worker
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
## 2021-01-19 ENCOUNTER — Ambulatory Visit (INDEPENDENT_AMBULATORY_CARE_PROVIDER_SITE_OTHER): Payer: 59 | Admitting: Psychiatry

## 2021-01-19 ENCOUNTER — Encounter (HOSPITAL_COMMUNITY): Payer: Self-pay | Admitting: Psychiatry

## 2021-01-19 ENCOUNTER — Other Ambulatory Visit: Payer: Self-pay

## 2021-01-19 VITALS — BP 114/80 | HR 89 | Ht 63.5 in | Wt 257.0 lb

## 2021-01-19 DIAGNOSIS — F17200 Nicotine dependence, unspecified, uncomplicated: Secondary | ICD-10-CM

## 2021-01-19 DIAGNOSIS — F410 Panic disorder [episodic paroxysmal anxiety] without agoraphobia: Secondary | ICD-10-CM | POA: Diagnosis not present

## 2021-01-19 DIAGNOSIS — F411 Generalized anxiety disorder: Secondary | ICD-10-CM

## 2021-01-19 MED ORDER — GABAPENTIN 100 MG PO CAPS: 100.0000 mg | ORAL_CAPSULE | Freq: Three times a day (TID) | ORAL | 0 refills | Status: AC

## 2021-01-19 MED ORDER — NICOTINE 14 MG/24HR TD PT24: 14.0000 mg | MEDICATED_PATCH | Freq: Every day | TRANSDERMAL | 0 refills | Status: DC

## 2021-01-19 MED ORDER — NICOTINE 14 MG/24HR TD PT24: 14.0000 mg | MEDICATED_PATCH | Freq: Every day | TRANSDERMAL | 2 refills | Status: AC

## 2021-01-27 ENCOUNTER — Ambulatory Visit (INDEPENDENT_AMBULATORY_CARE_PROVIDER_SITE_OTHER): Payer: 59 | Admitting: Licensed Clinical Social Worker

## 2021-01-27 ENCOUNTER — Other Ambulatory Visit: Payer: Self-pay

## 2021-01-27 DIAGNOSIS — F331 Major depressive disorder, recurrent, moderate: Secondary | ICD-10-CM

## 2021-01-28 ENCOUNTER — Ambulatory Visit (HOSPITAL_COMMUNITY): Payer: 59 | Admitting: Licensed Clinical Social Worker

## 2021-02-01 ENCOUNTER — Ambulatory Visit: Payer: 59
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
## 2021-02-16 ENCOUNTER — Encounter (HOSPITAL_COMMUNITY): Payer: 59 | Admitting: Psychiatry

## 2021-02-17 ENCOUNTER — Other Ambulatory Visit: Payer: Self-pay

## 2021-02-17 ENCOUNTER — Telehealth (HOSPITAL_COMMUNITY): Payer: Self-pay | Admitting: Licensed Clinical Social Worker

## 2021-02-17 ENCOUNTER — Ambulatory Visit (HOSPITAL_COMMUNITY): Payer: 59 | Admitting: Licensed Clinical Social Worker

## 2021-02-17 NOTE — Telephone Encounter (Signed)
Pt placed into cancellation appt for today at 2p with the understanding from admin staff she could probably not sign on until 2:10 -2:15. LCSW sent text link at 2:02 and remained online for session until 2:15. When pt failed to sign on LCSW called pt. Call went to vm. LCSW left detailed message re purpose of call and intent to remain online for session until 2:20. Pt failed to sign on for session.

## 2021-02-18 ENCOUNTER — Ambulatory Visit (HOSPITAL_COMMUNITY): Payer: Self-pay | Admitting: Licensed Clinical Social Worker

## 2021-03-03 ENCOUNTER — Other Ambulatory Visit: Payer: Self-pay

## 2021-03-03 ENCOUNTER — Ambulatory Visit
Admission: RE | Admit: 2021-03-03 | Discharge: 2021-03-03 | Disposition: A | Discharge status: Home / Self Care | Payer: 59 | Source: Ambulatory Visit | Attending: Internal Medicine | Admitting: Internal Medicine

## 2021-03-03 ENCOUNTER — Ambulatory Visit: Payer: 59

## 2021-03-03 DIAGNOSIS — Z1231 Encounter for screening mammogram for malignant neoplasm of breast: Secondary | ICD-10-CM

## 2021-03-12 ENCOUNTER — Encounter: Payer: Self-pay | Admitting: Internal Medicine

## 2021-03-14 ENCOUNTER — Other Ambulatory Visit: Payer: Self-pay | Admitting: Internal Medicine

## 2021-03-14 DIAGNOSIS — R928 Other abnormal and inconclusive findings on diagnostic imaging of breast: Secondary | ICD-10-CM

## 2021-03-14 MED ORDER — TRAZODONE HCL 50 MG PO TABS: 50.0000 mg | ORAL_TABLET | Freq: Every day | ORAL | 1 refills | Status: DC

## 2021-03-30 ENCOUNTER — Ambulatory Visit (HOSPITAL_COMMUNITY): Payer: 59 | Admitting: Licensed Clinical Social Worker

## 2021-03-31 ENCOUNTER — Encounter (HOSPITAL_COMMUNITY): Payer: 59 | Admitting: Psychiatry

## 2021-04-01 ENCOUNTER — Ambulatory Visit
Admission: RE | Admit: 2021-04-01 | Discharge: 2021-04-01 | Disposition: A | Discharge status: Home / Self Care | Payer: 59 | Source: Ambulatory Visit | Attending: Internal Medicine | Admitting: Internal Medicine

## 2021-04-01 DIAGNOSIS — R928 Other abnormal and inconclusive findings on diagnostic imaging of breast: Secondary | ICD-10-CM

## 2021-04-03 ENCOUNTER — Other Ambulatory Visit: Payer: Self-pay | Admitting: Internal Medicine

## 2021-04-03 DIAGNOSIS — F321 Major depressive disorder, single episode, moderate: Secondary | ICD-10-CM

## 2021-04-03 DIAGNOSIS — M170 Bilateral primary osteoarthritis of knee: Secondary | ICD-10-CM

## 2021-04-04 NOTE — Telephone Encounter (Signed)
Request future refill- 09/23/20 #[id number] Requested Prescriptions  Pending Prescriptions Disp Refills  . DULoxetine (CYMBALTA) 60 MG capsule [Pharmacy Med Name: DULOXETINE DR 60MG  CAPSULES] 30 capsule 5    Sig: TAKE 1 CAPSULE(60 MG) BY MOUTH DAILY     Psychiatry: Antidepressants - SNRI Failed - 04/03/2021 12:08 PM      Failed - Valid encounter within last 6 months    Recent Outpatient Visits          6 months ago Essential hypertension   [facility] [place], [place], [place]             Passed - Completed PHQ-2 or PHQ-9 in the last 360 days      Passed - Last BP in normal range    BP Readings from Last 1 Encounters:  09/23/20 118/77

## 2021-04-25 ENCOUNTER — Ambulatory Visit (INDEPENDENT_AMBULATORY_CARE_PROVIDER_SITE_OTHER): Payer: 59 | Admitting: Licensed Clinical Social Worker

## 2021-04-25 DIAGNOSIS — F330 Major depressive disorder, recurrent, mild: Secondary | ICD-10-CM

## 2021-05-02 ENCOUNTER — Ambulatory Visit: Payer: 59 | Attending: Family Medicine | Admitting: Family Medicine

## 2021-05-02 ENCOUNTER — Encounter: Payer: Self-pay | Admitting: Family Medicine

## 2021-05-02 ENCOUNTER — Other Ambulatory Visit: Payer: Self-pay

## 2021-05-02 VITALS — BP 130/79 | HR 98 | Ht 64.0 in | Wt 258.2 lb

## 2021-05-02 DIAGNOSIS — I10 Essential (primary) hypertension: Secondary | ICD-10-CM | POA: Diagnosis not present

## 2021-05-02 DIAGNOSIS — F321 Major depressive disorder, single episode, moderate: Secondary | ICD-10-CM | POA: Diagnosis not present

## 2021-05-02 DIAGNOSIS — Z23 Encounter for immunization: Secondary | ICD-10-CM

## 2021-05-02 DIAGNOSIS — M170 Bilateral primary osteoarthritis of knee: Secondary | ICD-10-CM

## 2021-05-02 DIAGNOSIS — E785 Hyperlipidemia, unspecified: Secondary | ICD-10-CM | POA: Diagnosis not present

## 2021-05-02 MED ORDER — LISINOPRIL-HYDROCHLOROTHIAZIDE 20-25 MG PO TABS: 1.0000 | ORAL_TABLET | Freq: Every day | ORAL | 1 refills | Status: DC

## 2021-05-02 MED ORDER — MELOXICAM 7.5 MG PO TABS: 7.5000 mg | ORAL_TABLET | Freq: Every day | ORAL | 3 refills | Status: DC

## 2021-05-02 MED ORDER — AMLODIPINE BESYLATE 10 MG PO TABS
10.0000 mg | ORAL_TABLET | Freq: Every day | ORAL | 1 refills | Status: DC
Start: 2021-05-02 — End: 2021-09-29

## 2021-05-02 MED ORDER — DULOXETINE HCL 60 MG PO CPEP
60.0000 mg | ORAL_CAPSULE | Freq: Every day | ORAL | 1 refills | Status: DC
Start: 2021-05-02 — End: 2021-09-29

## 2021-05-02 MED ORDER — CYCLOBENZAPRINE HCL 10 MG PO TABS: 10.0000 mg | ORAL_TABLET | Freq: Two times a day (BID) | ORAL | 3 refills | Status: DC | PRN

## 2021-05-02 MED ORDER — ROSUVASTATIN CALCIUM 40 MG PO TABS
40.0000 mg | ORAL_TABLET | Freq: Every day | ORAL | 1 refills | Status: DC
Start: 2021-05-02 — End: 2021-09-29

## 2021-05-02 MED ORDER — TRAZODONE HCL 50 MG PO TABS: 50.0000 mg | ORAL_TABLET | Freq: Every day | ORAL | 3 refills | Status: DC

## 2021-05-02 NOTE — Progress Notes (Signed)
Subjective:  Patient ID: Kristen Cummings, female    DOB: 09-04-1970  Age: 50 y.o. MRN: [id number]  CC: Hypertension   HPI Kristen Cummings is a 50 y.o. year old female with a history of hypertension, hyperlipidemia, bilateral knee osteoarthritis, anxiety, obesity here for chronic disease management. Last seen by PCP in 09/2020.  Interval History: She takes Naproxen and Duloxetine for her chronic knee pain and would like to switch to something stronger.  Previously received cortisone injections from orthopedics but once injections wore off, intensity of pain worsened.  She receives Gabapentin from Pscyh at [facility] for her anxiety and will need refills as she is unable to take additional time off her job to go for her appointment with psych. Compliant with her antihypertensive and her statin. Denies additional concerns today.  History reviewed. No pertinent past medical history.  Past Surgical History:  Procedure Laterality Date   BREAST EXCISIONAL BIOPSY Right 2011   KNEE ARTHROSCOPY     TUBAL LIGATION      Family History  Problem Relation Age of Onset   Heart disease Father    Breast cancer Sister    Breast cancer Paternal Aunt    Breast cancer Paternal Grandmother     No Known Allergies  Outpatient Medications Prior to Visit  Medication Sig Dispense Refill   Melatonin 10 MG TABS Take by mouth.     nicotine (NICODERM CQ - DOSED IN MG/24 HOURS) 14 mg/24hr patch Place 1 patch (14 mg total) onto the skin daily. 28 patch 2   traMADol (ULTRAM) 50 MG tablet Take by mouth every 6 (six) hours as needed.     amLODipine (NORVASC) 10 MG tablet Take 1 tablet (10 mg total) by mouth daily. 30 tablet 11   cyclobenzaprine (FLEXERIL) 10 MG tablet Take 1 tablet (10 mg total) by mouth 2 (two) times daily as needed for muscle spasms. 30 tablet 2   DULoxetine (CYMBALTA) 60 MG capsule Take 1 capsule (60 mg total) by mouth daily. 30 capsule 5   lisinopril-hydrochlorothiazide (ZESTORETIC) 20-25  MG tablet Take 1 tablet by mouth daily. 90 tablet 3   naproxen (NAPROSYN) 500 MG tablet Take 1 tablet (500 mg total) by mouth 2 (two) times daily with a meal. 60 tablet 2   rosuvastatin (CRESTOR) 40 MG tablet Take 1 tablet (40 mg total) by mouth daily. 30 tablet 11   traZODone (DESYREL) 50 MG tablet Take 1 tablet (50 mg total) by mouth at bedtime. 30 tablet 1   Cholecalciferol (VITAMIN D3) 25 MCG (1000 UT) CAPS Take by mouth. (Patient not taking: Reported on 05/02/2021)     gabapentin (NEURONTIN) 100 MG capsule Take 1 capsule (100 mg total) by mouth 3 (three) times daily. 270 capsule 0   No facility-administered medications prior to visit.     ROS Review of Systems  Constitutional:  Negative for activity change, appetite change and fatigue.  HENT:  Negative for congestion, sinus pressure and sore throat.   Eyes:  Negative for visual disturbance.  Respiratory:  Negative for cough, chest tightness, shortness of breath and wheezing.   Cardiovascular:  Negative for chest pain and palpitations.  Gastrointestinal:  Negative for abdominal distention, abdominal pain and constipation.  Endocrine: Negative for polydipsia.  Genitourinary:  Negative for dysuria and frequency.  Musculoskeletal:        See HPI  Skin:  Negative for rash.  Neurological:  Negative for tremors, light-headedness and numbness.  Hematological:  Does not bruise/bleed easily.  Psychiatric/Behavioral:  Negative  for agitation and behavioral problems.    Objective:  BP 130/79   Pulse 98   Ht 5\' 4"  (1.626 m)   Wt 258 lb 3.2 oz (117.1 kg)   SpO2 98%   BMI 44.32 kg/m   BP/Weight 05/02/2021 0000000 [id number]  Systolic BP [id number]  Diastolic BP 79 77 72  Wt. (Lbs) 258.2 256 -  BMI 44.32 44.64 -  Some encounter information is confidential and restricted. Go to Review Flowsheets activity to see all data.      Physical Exam Constitutional:      Appearance: She is well-developed. She is obese.  Cardiovascular:      Rate and Rhythm: Normal rate.     Heart sounds: Normal heart sounds. No murmur heard. Pulmonary:     Effort: Pulmonary effort is normal.     Breath sounds: Normal breath sounds. No wheezing or rales.  Chest:     Chest wall: No tenderness.  Abdominal:     General: Bowel sounds are normal. There is no distension.     Palpations: Abdomen is soft. There is no mass.     Tenderness: There is no abdominal tenderness.  Musculoskeletal:     Right lower leg: No edema.     Left lower leg: No edema.     Comments: Tenderness on active and passive range of motion of both knees  Neurological:     Mental Status: She is alert and oriented to person, place, and time.     Gait: Gait abnormal.  Psychiatric:        Mood and Affect: Mood normal.    CMP Latest Ref Rng & Units 09/23/2020  Glucose 65 - 99 mg/dL 112(H)  BUN 6 - 24 mg/dL 18  Creatinine 0.57 - 1.00 mg/dL 0.98  Sodium 134 - 144 mmol/L 140  Potassium 3.5 - 5.2 mmol/L 4.2  Chloride 96 - 106 mmol/L 99  CO2 20 - 29 mmol/L 25  Calcium 8.7 - 10.2 mg/dL 10.2  Total Protein 6.0 - 8.5 g/dL 7.7  Total Bilirubin 0.0 - 1.2 mg/dL 0.3  Alkaline Phos 44 - 121 IU/L 117  AST 0 - 40 IU/L 18  ALT 0 - 32 IU/L 21    Lipid Panel     Component Value Date/Time   CHOL 196 09/23/2020 1516   TRIG 215 (H) 09/23/2020 1516   HDL 48 09/23/2020 1516   CHOLHDL 4.1 09/23/2020 1516   LDLCALC 111 (H) 09/23/2020 1516    CBC    Component Value Date/Time   WBC 9.2 09/23/2020 1516   RBC 3.99 09/23/2020 1516   HGB 12.6 09/23/2020 1516   HCT 37.9 09/23/2020 1516   PLT 424 09/23/2020 1516   MCV 95 09/23/2020 1516   MCH 31.6 09/23/2020 1516   MCHC 33.2 09/23/2020 1516   RDW 11.4 (L) 09/23/2020 1516    No results found for: HGBA1C  Assessment & Plan:  1. Essential hypertension Controlled Counseled on blood pressure goal of less than 130/80, low-sodium, DASH diet, medication compliance, 150 minutes of moderate intensity exercise per week. Discussed  medication compliance, adverse effects. - amLODipine (NORVASC) 10 MG tablet; Take 1 tablet (10 mg total) by mouth daily.  Dispense: 90 tablet; Refill: 1 - lisinopril-hydrochlorothiazide (ZESTORETIC) 20-25 MG tablet; Take 1 tablet by mouth daily.  Dispense: 90 tablet; Refill: 1 - Basic Metabolic Panel  2. Primary osteoarthritis of both knees Uncontrolled Switched from naproxen to meloxicam Weight loss will be beneficial If symptoms persist she might  need to be evaluated by orthopedics for possible knee joint replacement - meloxicam (MOBIC) 7.5 MG tablet; Take 1 tablet (7.5 mg total) by mouth daily.  Dispense: 30 tablet; Refill: 3 - DULoxetine (CYMBALTA) 60 MG capsule; Take 1 capsule (60 mg total) by mouth daily.  Dispense: 90 capsule; Refill: 1 - cyclobenzaprine (FLEXERIL) 10 MG tablet; Take 1 tablet (10 mg total) by mouth 2 (two) times daily as needed for muscle spasms.  Dispense: 30 tablet; Refill: 3  3. Major depressive disorder, single episode, moderate (HCC) Stable Also on gabapentin from psych but I have refilled this as she will be unable to make her appointment to see psych. - DULoxetine (CYMBALTA) 60 MG capsule; Take 1 capsule (60 mg total) by mouth daily.  Dispense: 90 capsule; Refill: 1  4. Hyperlipidemia, unspecified hyperlipidemia type Controlled - rosuvastatin (CRESTOR) 40 MG tablet; Take 1 tablet (40 mg total) by mouth daily.  Dispense: 90 tablet; Refill: 1    Meds ordered this encounter  Medications   meloxicam (MOBIC) 7.5 MG tablet    Sig: Take 1 tablet (7.5 mg total) by mouth daily.    Dispense:  30 tablet    Refill:  3    Discontinue Naproxen   amLODipine (NORVASC) 10 MG tablet    Sig: Take 1 tablet (10 mg total) by mouth daily.    Dispense:  90 tablet    Refill:  1   DULoxetine (CYMBALTA) 60 MG capsule    Sig: Take 1 capsule (60 mg total) by mouth daily.    Dispense:  90 capsule    Refill:  1   lisinopril-hydrochlorothiazide (ZESTORETIC) 20-25 MG tablet     Sig: Take 1 tablet by mouth daily.    Dispense:  90 tablet    Refill:  1   rosuvastatin (CRESTOR) 40 MG tablet    Sig: Take 1 tablet (40 mg total) by mouth daily.    Dispense:  90 tablet    Refill:  1   traZODone (DESYREL) 50 MG tablet    Sig: Take 1 tablet (50 mg total) by mouth at bedtime.    Dispense:  30 tablet    Refill:  3   cyclobenzaprine (FLEXERIL) 10 MG tablet    Sig: Take 1 tablet (10 mg total) by mouth 2 (two) times daily as needed for muscle spasms.    Dispense:  30 tablet    Refill:  3    Follow-up: Return in about 3 months (around 07/31/2021) for Medical conditions with PCP.       Charlott Rakes, MD, FAAFP. [facility] [place], [place]   05/02/2021, 2:15 PM

## 2021-05-02 NOTE — Patient Instructions (Signed)
Osteoarthritis Osteoarthritis is a type of arthritis. It refers to joint pain or joint disease. Osteoarthritis affects tissue that covers the ends of bones in joints (cartilage). Cartilage acts as a cushion between the bones and helps them move smoothly. Osteoarthritis occurs when cartilage in the joints gets worn down. Osteoarthritis is sometimes called "wear and tear" arthritis. Osteoarthritis is the most common form of arthritis. It often occurs in older people. It is a condition that gets worse over time. The joints most often affected by this condition are in the fingers, toes, hips, knees, and spine, including the neck and lower back. What are the causes? This condition is caused by the wearing down of cartilage that covers the ends of bones. What increases the risk? The following factors may make you more likely to develop this condition: Being age 50 or older. Obesity. Overuse of joints. Past injury of a joint. Past surgery on a joint. Family history of osteoarthritis. What are the signs or symptoms? The main symptoms of this condition are pain, swelling, and stiffness in the joint. Other symptoms may include: An enlarged joint. More pain and further damage caused by small pieces of bone or cartilage that break off and float inside of the joint. Small deposits of bone (osteophytes) that grow on the edges of the joint. A grating or scraping feeling inside the joint when you move it. Popping or creaking sounds when you move. Difficulty walking or exercising. An inability to grip items, twist your hand(s), or control the movements of your hands and fingers. How is this diagnosed? This condition may be diagnosed based on: Your medical history. A physical exam. Your symptoms. X-rays of the affected joint(s). Blood tests to rule out other types of arthritis. How is this treated? There is no cure for this condition, but treatment can help control pain and improve joint function.  Treatment may include a combination of therapies, such as: Pain relief techniques, such as: Applying heat and cold to the joint. Massage. A form of talk therapy called cognitive behavioral therapy (CBT). This therapy helps you set goals and follow up on the changes that you make. Medicines for pain and inflammation. The medicines can be taken by mouth or applied to the skin. They include: NSAIDs, such as ibuprofen. Prescription medicines. Strong anti-inflammatory medicines (corticosteroids). Certain nutritional supplements. A prescribed exercise program. You may work with a physical therapist. Assistive devices, such as a brace, wrap, splint, specialized glove, or cane. A weight control plan. Surgery, such as: An osteotomy. This is done to reposition the bones and relieve pain or to remove loose pieces of bone and cartilage. Joint replacement surgery. You may need this surgery if you have advanced osteoarthritis. Follow these instructions at home: Activity Rest your affected joints as told by your health care provider. Exercise as told by your health care provider. He or she may recommend specific types of exercise, such as: Strengthening exercises. These are done to strengthen the muscles that support joints affected by arthritis. Aerobic activities. These are exercises, such as brisk walking or water aerobics, that increase your heart rate. Range-of-motion activities. These help your joints move more easily. Balance and agility exercises. Managing pain, stiffness, and swelling   If directed, apply heat to the affected area as often as told by your health care provider. Use the heat source that your health care provider recommends, such as a moist heat pack or a heating pad. If you have a removable assistive device, remove it as told by   your health care provider. Place a towel between your skin and the heat source. If your health care provider tells you to keep the assistive device on  while you apply heat, place a towel between the assistive device and the heat source. Leave the heat on for 20-30 minutes. Remove the heat if your skin turns bright red. This is especially important if you are unable to feel pain, heat, or cold. You may have a greater risk of getting burned. If directed, put ice on the affected area. To do this: If you have a removable assistive device, remove it as told by your health care provider. Put ice in a plastic bag. Place a towel between your skin and the bag. If your health care provider tells you to keep the assistive device on during icing, place a towel between the assistive device and the bag. Leave the ice on for 20 minutes, 2-3 times a day. Move your fingers or toes often to reduce stiffness and swelling. Raise (elevate) the injured area above the level of your heart while you are sitting or lying down. General instructions Take over-the-counter and prescription medicines only as told by your health care provider. Maintain a healthy weight. Follow instructions from your health care provider for weight control. Do not use any products that contain nicotine or tobacco, such as cigarettes, e-cigarettes, and chewing tobacco. If you need help quitting, ask your health care provider. Use assistive devices as told by your health care provider. Keep all follow-up visits as told by your health care provider. This is important. Where to find more information National Institute of Arthritis and Musculoskeletal and Skin Diseases: www.niams.nih.gov National Institute on Aging: www.nia.nih.gov American College of Rheumatology: www.rheumatology.org Contact a health care provider if: You have redness, swelling, or a feeling of warmth in a joint that gets worse. You have a fever along with joint or muscle aches. You develop a rash. You have trouble doing your normal activities. Get help right away if: You have pain that gets worse and is not relieved by  pain medicine. Summary Osteoarthritis is a type of arthritis that affects tissue covering the ends of bones in joints (cartilage). This condition is caused by the wearing down of cartilage that covers the ends of bones. The main symptom of this condition is pain, swelling, and stiffness in the joint. There is no cure for this condition, but treatment can help control pain and improve joint function. This information is not intended to replace advice given to you by your health care provider. Make sure you discuss any questions you have with your health care provider. Document Revised: 05/05/2019 Document Reviewed: 05/05/2019 Elsevier Patient Education  2022 Elsevier Inc.  

## 2021-05-02 NOTE — Progress Notes (Signed)
Needs refills on all medication. 

## 2021-05-03 LAB — BASIC METABOLIC PANEL
BUN/Creatinine Ratio: 15 (ref 9–23)
BUN: 15 mg/dL (ref 6–24)
CO2: 26 mmol/L (ref 20–29)
Calcium: 10.3 mg/dL — ABNORMAL HIGH (ref 8.7–10.2)
Chloride: 99 mmol/L (ref 96–106)
Creatinine, Ser: 0.98 mg/dL (ref 0.57–1.00)
Glucose: 100 mg/dL — ABNORMAL HIGH (ref 70–99)
Potassium: 3.9 mmol/L (ref 3.5–5.2)
Sodium: 142 mmol/L (ref 134–144)
eGFR: 70 mL/min/{1.73_m2} (ref >59)

## 2021-05-04 ENCOUNTER — Other Ambulatory Visit: Payer: Self-pay | Admitting: Internal Medicine

## 2021-05-04 NOTE — Telephone Encounter (Signed)
Requested Prescriptions  Pending Prescriptions Disp Refills   traZODone (DESYREL) 50 MG tablet [Pharmacy Med Name: TRAZODONE 50MG  TABLETS] 30 tablet 3    Sig: TAKE 1 TABLET(50 MG) BY MOUTH AT BEDTIME     Psychiatry: Antidepressants - Serotonin Modulator Passed - 05/04/2021  6:21 AM      Passed - Completed PHQ-2 or PHQ-9 in the last 360 days      Passed - Valid encounter within last 6 months    Recent Outpatient Visits          2 days ago Need for immunization against influenza   [facility], MD   7 months ago Essential hypertension   [facility], MD

## 2021-05-05 ENCOUNTER — Encounter (HOSPITAL_COMMUNITY): Payer: 59 | Admitting: Psychiatry

## 2021-05-06 NOTE — Progress Notes (Signed)
° °  THERAPIST PROGRESS NOTE   Virtual Visit via Video Note  I connected with Kristen Cummings on 04/25/21 at 11:00 AM EST by a video enabled telemedicine application and verified that I am speaking with the correct person using two identifiers.  Location: Patient: Home Provider: [facility]   I discussed the limitations of evaluation and management by telemedicine and the availability of in person appointments. The patient expressed understanding and agreed to proceed. I discussed the assessment and treatment plan with the patient. The patient was provided an opportunity to ask questions and all were answered. The patient agreed with the plan and demonstrated an understanding of the instructions.   The patient was advised to call back or seek an in-person evaluation if the symptoms worsen or if the condition fails to improve as anticipated.  I provided 45 minutes of non-face-to-face time during this encounter.  Participation Level: Active  Behavioral Response: CasualAlertDepressed  Type of Therapy: Individual Therapy  Treatment Goals addressed: Communication: dep/anx/coping  Interventions: CBT, Solution Focused, and Supportive  Summary: Kristen Cummings is a 50 y.o. female who presents with hx of dep/anx.  Today patient logs on for video session per schedule.  Patient last seen January 27, 2021.  Patient was a no-show on 9/29 and canceled appointment 11/9.  Scheduling addressed.  LCSW assessed for any significant changes since last session.  Patient reports medication adjustments which have been very helpful. Patient reports she is sleeping well with trazodone, patient reports better mood with reduction in anxiety and worry.  Patient reports she has been working on her ability to say no.  She states she did in fact tell her family that she could not go on the [facility] trip for Thanksgiving.  Patient states "it felt good to say no".  Patient further states it feels better to live within her  means.  She states she had certain expectations of what her life would be like by the age of 42.  She states she did not expect to be struggling financially, thought she would have a home of her own and be very comfortable.  Reports feelings of "shame".  LCSW assisted patient to process thoughts and feelings.  Introduced self talk and CBT, meditation with Daily Calm.  Mailed self talk literature post session.  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  She states she is "excited" about this job and having a steady income again.  LCSW congratulated patient wished her well in her new position and wished her happy early birthday. LCSW reviewed poc including scheduling prior to close of session. Pt states appreciation for care.   Suicidal/Homicidal: Nowithout intent/plan  Therapist Response: Pt receptive to care.  Plan: Return again in ~5 weeks.  Diagnosis: Axis I:  MDD, mild  Tuttle Sink, LCSW 04/25/21

## 2021-06-08 ENCOUNTER — Ambulatory Visit (HOSPITAL_COMMUNITY): Payer: 59 | Admitting: Licensed Clinical Social Worker

## 2021-06-23 ENCOUNTER — Encounter (HOSPITAL_COMMUNITY): Payer: No Payment, Other | Admitting: Psychiatry

## 2021-07-07 ENCOUNTER — Ambulatory Visit (HOSPITAL_COMMUNITY): Payer: No Payment, Other | Admitting: Licensed Clinical Social Worker

## 2021-07-07 ENCOUNTER — Encounter (HOSPITAL_COMMUNITY): Payer: Self-pay

## 2021-07-07 ENCOUNTER — Telehealth (HOSPITAL_COMMUNITY): Payer: Self-pay | Admitting: Licensed Clinical Social Worker

## 2021-07-07 NOTE — Telephone Encounter (Signed)
LCSW sent text message with link for video session per schedule. LCSW remained online for session until 8:12am. Pt failed to sign on for session.

## 2021-07-21 ENCOUNTER — Ambulatory Visit (HOSPITAL_COMMUNITY): Payer: Self-pay | Admitting: Licensed Clinical Social Worker

## 2021-07-29 ENCOUNTER — Ambulatory Visit (HOSPITAL_COMMUNITY): Payer: No Payment, Other | Admitting: Licensed Clinical Social Worker

## 2021-07-29 ENCOUNTER — Telehealth (HOSPITAL_COMMUNITY): Payer: Self-pay | Admitting: Licensed Clinical Social Worker

## 2021-07-29 ENCOUNTER — Encounter (HOSPITAL_COMMUNITY): Payer: Self-pay

## 2021-07-29 NOTE — Telephone Encounter (Signed)
LCSW sent text message with link for video session per schedule. LCSW remained available online until 8:14am. Pt failed to sign on for session. ?

## 2021-08-11 ENCOUNTER — Ambulatory Visit (HOSPITAL_COMMUNITY): Payer: Self-pay | Admitting: Licensed Clinical Social Worker

## 2021-08-18 ENCOUNTER — Ambulatory Visit (HOSPITAL_COMMUNITY): Payer: No Payment, Other | Admitting: Licensed Clinical Social Worker

## 2021-09-02 ENCOUNTER — Other Ambulatory Visit: Payer: Self-pay | Admitting: Family Medicine

## 2021-09-02 DIAGNOSIS — M170 Bilateral primary osteoarthritis of knee: Secondary | ICD-10-CM

## 2021-09-29 ENCOUNTER — Encounter: Payer: Self-pay | Admitting: Physician Assistant

## 2021-09-29 ENCOUNTER — Ambulatory Visit: Payer: 59 | Attending: Physician Assistant | Admitting: Physician Assistant

## 2021-09-29 VITALS — BP 109/73 | HR 100 | Temp 97.6°F | Resp 18 | Ht 64.0 in | Wt 256.0 lb

## 2021-09-29 DIAGNOSIS — M170 Bilateral primary osteoarthritis of knee: Secondary | ICD-10-CM | POA: Diagnosis not present

## 2021-09-29 DIAGNOSIS — Z131 Encounter for screening for diabetes mellitus: Secondary | ICD-10-CM

## 2021-09-29 DIAGNOSIS — I10 Essential (primary) hypertension: Secondary | ICD-10-CM | POA: Diagnosis not present

## 2021-09-29 DIAGNOSIS — Z8639 Personal history of other endocrine, nutritional and metabolic disease: Secondary | ICD-10-CM

## 2021-09-29 DIAGNOSIS — R5383 Other fatigue: Secondary | ICD-10-CM

## 2021-09-29 DIAGNOSIS — E785 Hyperlipidemia, unspecified: Secondary | ICD-10-CM

## 2021-09-29 DIAGNOSIS — F321 Major depressive disorder, single episode, moderate: Secondary | ICD-10-CM

## 2021-09-29 MED ORDER — TRAZODONE HCL 50 MG PO TABS: 50.0000 mg | ORAL_TABLET | Freq: Every day | ORAL | 1 refills | Status: AC

## 2021-09-29 MED ORDER — CYCLOBENZAPRINE HCL 10 MG PO TABS: 10.0000 mg | ORAL_TABLET | Freq: Two times a day (BID) | ORAL | 1 refills | Status: AC | PRN

## 2021-09-29 MED ORDER — MELOXICAM 7.5 MG PO TABS: 7.5000 mg | ORAL_TABLET | Freq: Every day | ORAL | 1 refills | Status: DC

## 2021-09-29 MED ORDER — LISINOPRIL-HYDROCHLOROTHIAZIDE 20-25 MG PO TABS: 1.0000 | ORAL_TABLET | Freq: Every day | ORAL | 1 refills | Status: AC

## 2021-09-29 MED ORDER — ROSUVASTATIN CALCIUM 40 MG PO TABS: 40.0000 mg | ORAL_TABLET | Freq: Every day | ORAL | 1 refills | Status: AC

## 2021-09-29 MED ORDER — DULOXETINE HCL 60 MG PO CPEP: 60.0000 mg | ORAL_CAPSULE | Freq: Every day | ORAL | 1 refills | Status: AC

## 2021-09-29 MED ORDER — AMLODIPINE BESYLATE 10 MG PO TABS: 10.0000 mg | ORAL_TABLET | Freq: Every day | ORAL | 1 refills | Status: AC

## 2021-09-29 NOTE — Progress Notes (Signed)
? ?Kristen Cummings, is a 51 y.o. female ? ?UEK:[id number] ? ?XTA:[id number] ? ?DOB - 29-Jun-1970 ? ?Chief Complaint  ?Patient presents with  ? Medication Refill  ?    ? ?Subjective:  ? ?Kristen Cummings is a 51 y.o. female here today for med RF.  Denies any concerns other than very fatigued.  She works about 6 days a week.  Works day shift.  This has been going on for months.  Compliant with meds.  No CP/HA/SOB ? ?No problems updated. ? ?ALLERGIES: ?No Known Allergies ? ?PAST MEDICAL HISTORY: ?History reviewed. No pertinent past medical history. ? ?MEDICATIONS AT HOME: ?Prior to Admission medications   ?Medication Sig Start Date End Date Taking? Authorizing Provider  ?Cholecalciferol (VITAMIN D3) 25 MCG (1000 UT) CAPS Take by mouth.   Yes [provider]  ?Melatonin 10 MG TABS Take by mouth.   Yes [provider]  ?nicotine (NICODERM CQ - DOSED IN MG/24 HOURS) 14 mg/24hr patch Place 1 patch (14 mg total) onto the skin daily. 01/19/21  Yes Karsten Ro, MD  ?traMADol (ULTRAM) 50 MG tablet Take by mouth every 6 (six) hours as needed.   Yes [provider]  ?amLODipine (NORVASC) 10 MG tablet Take 1 tablet (10 mg total) by mouth daily. 09/29/21   Anders Simmonds, PA-C  ?cyclobenzaprine (FLEXERIL) 10 MG tablet Take 1 tablet (10 mg total) by mouth 2 (two) times daily as needed for muscle spasms. 09/29/21   Anders Simmonds, PA-C  ?DULoxetine (CYMBALTA) 60 MG capsule Take 1 capsule (60 mg total) by mouth daily. 09/29/21   Anders Simmonds, PA-C  ?gabapentin (NEURONTIN) 100 MG capsule Take 1 capsule (100 mg total) by mouth 3 (three) times daily. ?Patient not taking: Reported on 09/29/2021 01/19/21 04/19/21  Karsten Ro, MD  ?lisinopril-hydrochlorothiazide (ZESTORETIC) 20-25 MG tablet Take 1 tablet by mouth daily. 09/29/21   Anders Simmonds, PA-C  ?meloxicam (MOBIC) 7.5 MG tablet Take 1 tablet (7.5 mg total) by mouth daily. 09/29/21   Anders Simmonds, PA-C  ?rosuvastatin (CRESTOR) 40 MG tablet  Take 1 tablet (40 mg total) by mouth daily. 09/29/21   Anders Simmonds, PA-C  ?traZODone (DESYREL) 50 MG tablet Take 1 tablet (50 mg total) by mouth at bedtime. 09/29/21   Anders Simmonds, PA-C  ? ? ?ROS: ?Neg HEENT ?Neg resp ?Neg cardiac ?Neg GI ?Neg GU ?Neg MS ?Neg psych ?Neg neuro ? ?Objective:  ? ?Vitals:  ? 09/29/21 1606  ?BP: 109/73  ?Pulse: 100  ?Resp: 18  ?Temp: 97.6 ?F (36.4 ?C)  ?TempSrc: Oral  ?SpO2: 94%  ?Weight: 256 lb (116.1 kg)  ?Height: 5\' 4"  (1.626 m)  ? ?Exam ?General appearance : Awake, alert, not in any distress. Speech Clear. Not toxic looking ?HEENT: Atraumatic and Normocephalic ?Neck: Supple, no JVD. No cervical lymphadenopathy.  ?Chest: Good air entry bilaterally, CTAB.  No rales/rhonchi/wheezing ?CVS: S1 S2 regular, no murmurs.  ?Extremities: B/L Lower Ext shows no edema, both legs are warm to touch ?Neurology: Awake alert, and oriented X 3, CN II-XII intact, Non focal ?Skin: No Rash ? ?Data Review ?No results found for: HGBA1C ? ?Assessment & Plan  ? ?1. Hyperlipidemia, unspecified hyperlipidemia type ?- rosuvastatin (CRESTOR) 40 MG tablet; Take 1 tablet (40 mg total) by mouth daily.  Dispense: 90 tablet; Refill: 1 ? ?2. Primary osteoarthritis of both knees ?- meloxicam (MOBIC) 7.5 MG tablet; Take 1 tablet (7.5 mg total) by mouth daily.  Dispense: 30 tablet; Refill: 1 ?- DULoxetine (CYMBALTA)  60 MG capsule; Take 1 capsule (60 mg total) by mouth daily.  Dispense: 90 capsule; Refill: 1 ?- cyclobenzaprine (FLEXERIL) 10 MG tablet; Take 1 tablet (10 mg total) by mouth 2 (two) times daily as needed for muscle spasms.  Dispense: 30 tablet; Refill: 1 ? ?3. Essential hypertension ?controlled ?- lisinopril-hydrochlorothiazide (ZESTORETIC) 20-25 MG tablet; Take 1 tablet by mouth daily.  Dispense: 90 tablet; Refill: 1 ?- amLODipine (NORVASC) 10 MG tablet; Take 1 tablet (10 mg total) by mouth daily.  Dispense: 90 tablet; Refill: 1 ?- Comprehensive metabolic panel; Future ?- CBC with Differential ? ?4.  Major depressive disorder, single episode, moderate (HCC) ?stable ?- traZODone (DESYREL) 50 MG tablet; Take 1 tablet (50 mg total) by mouth at bedtime.  Dispense: 90 tablet; Refill: 1 ?- DULoxetine (CYMBALTA) 60 MG capsule; Take 1 capsule (60 mg total) by mouth daily.  Dispense: 90 capsule; Refill: 1 ? ?5. Encounter for screening for diabetes mellitus ?- Hemoglobin A1c ? ?6. History of vitamin D deficiency ?- Vitamin D, 25-hydroxy ? ?7. Fatigue, unspecified type ?- Thyroid Panel With TSH ? ? ? ?Return in about 6 months (around 04/01/2022) for PCP for chronic conditions. ? ?The patient was given clear instructions to go to ER or return to medical center if symptoms don't improve, worsen or new problems develop. The patient verbalized understanding. The patient was told to call to get lab results if they haven't heard anything in the next week.  ? ? ? ? ?[place]ian Co, PA-C ?[facility] ?[place], [place] ?[phone]   ?09/29/2021, 4:33 PM Patient ID: Kristen Cummings, female   DOB: [birth year], 51 y.o.   MRN: [id number] ? ?

## 2021-09-30 ENCOUNTER — Telehealth: Payer: Self-pay | Admitting: Internal Medicine

## 2021-09-30 LAB — COMPREHENSIVE METABOLIC PANEL
ALT: 18 IU/L (ref 0–32)
AST: 17 IU/L (ref 0–40)
Albumin/Globulin Ratio: 1.6 (ref 1.2–2.2)
Albumin: 4.8 g/dL (ref 3.8–4.9)
Alkaline Phosphatase: 112 IU/L (ref 44–121)
BUN/Creatinine Ratio: 19 (ref 9–23)
BUN: 19 mg/dL (ref 6–24)
Bilirubin Total: 0.2 mg/dL (ref 0.0–1.2)
CO2: 26 mmol/L (ref 20–29)
Calcium: 10.3 mg/dL — ABNORMAL HIGH (ref 8.7–10.2)
Chloride: 99 mmol/L (ref 96–106)
Creatinine, Ser: 0.98 mg/dL (ref 0.57–1.00)
Globulin, Total: 3 g/dL (ref 1.5–4.5)
Glucose: 142 mg/dL — ABNORMAL HIGH (ref 70–99)
Potassium: 3.7 mmol/L (ref 3.5–5.2)
Sodium: 142 mmol/L (ref 134–144)
Total Protein: 7.8 g/dL (ref 6.0–8.5)
eGFR: 69 mL/min/{1.73_m2} (ref >59)

## 2021-09-30 LAB — CBC WITH DIFFERENTIAL/PLATELET
Basophils Absolute: 0 10*3/uL (ref 0.0–0.2)
Basos: 0 %
EOS (ABSOLUTE): 0.2 10*3/uL (ref 0.0–0.4)
Eos: 2 %
Hematocrit: 37.1 % (ref 34.0–46.6)
Hemoglobin: 12.5 g/dL (ref 11.1–15.9)
Immature Grans (Abs): 0 10*3/uL (ref 0.0–0.1)
Immature Granulocytes: 0 %
Lymphocytes Absolute: 3.9 10*3/uL — ABNORMAL HIGH (ref 0.7–3.1)
Lymphs: 43 %
MCH: 31.4 pg (ref 26.6–33.0)
MCHC: 33.7 g/dL (ref 31.5–35.7)
MCV: 93 fL (ref 79–97)
Monocytes Absolute: 0.5 10*3/uL (ref 0.1–0.9)
Monocytes: 6 %
Neutrophils Absolute: 4.4 10*3/uL (ref 1.4–7.0)
Neutrophils: 49 %
Platelets: 392 10*3/uL (ref 150–450)
RBC: 3.98 x10E6/uL (ref 3.77–5.28)
RDW: 11.5 % — ABNORMAL LOW (ref 11.7–15.4)
WBC: 9.1 10*3/uL (ref 3.4–10.8)

## 2021-09-30 LAB — THYROID PANEL WITH TSH
Free Thyroxine Index: 1.8 (ref 1.2–4.9)
T3 Uptake Ratio: 25 % (ref 24–39)
T4, Total: 7 ug/dL (ref 4.5–12.0)
TSH: 0.736 u[IU]/mL (ref 0.450–4.500)

## 2021-09-30 LAB — HEMOGLOBIN A1C
Est. average glucose Bld gHb Est-mCnc: 120 mg/dL
Hgb A1c MFr Bld: 5.8 % — ABNORMAL HIGH (ref 4.8–5.6)

## 2021-09-30 LAB — VITAMIN D 25 HYDROXY (VIT D DEFICIENCY, FRACTURES): Vit D, 25-Hydroxy: 49.9 ng/mL (ref 30.0–100.0)

## 2021-09-30 NOTE — Telephone Encounter (Signed)
Pt given lab results per notes of Kristen Cummings, [name] on 09/30/21. Pt verbalized understanding. ? ?

## 2021-09-30 NOTE — Telephone Encounter (Signed)
Pt returned call for lab results, no nurse available. Please advise  ?

## 2021-12-04 ENCOUNTER — Encounter: Payer: Self-pay | Admitting: Physician Assistant

## 2021-12-05 ENCOUNTER — Other Ambulatory Visit: Payer: Self-pay | Admitting: Physician Assistant

## 2021-12-05 DIAGNOSIS — M170 Bilateral primary osteoarthritis of knee: Secondary | ICD-10-CM

## 2021-12-05 MED ORDER — MELOXICAM 7.5 MG PO TABS: 7.5000 mg | ORAL_TABLET | Freq: Every day | ORAL | 3 refills | Status: AC

## 2022-03-29 ENCOUNTER — Other Ambulatory Visit: Payer: Self-pay | Admitting: Surgery

## 2022-03-29 DIAGNOSIS — R2231 Localized swelling, mass and lump, right upper limb: Secondary | ICD-10-CM

## 2022-04-03 ENCOUNTER — Ambulatory Visit: Payer: 59 | Admitting: Internal Medicine

## 2022-04-08 ENCOUNTER — Ambulatory Visit (HOSPITAL_BASED_OUTPATIENT_CLINIC_OR_DEPARTMENT_OTHER): Payer: Commercial Managed Care - HMO

## 2022-04-08 ENCOUNTER — Encounter (HOSPITAL_BASED_OUTPATIENT_CLINIC_OR_DEPARTMENT_OTHER): Payer: Self-pay

## 2022-04-12 ENCOUNTER — Other Ambulatory Visit: Payer: Self-pay | Admitting: Surgery

## 2022-04-12 DIAGNOSIS — R2231 Localized swelling, mass and lump, right upper limb: Secondary | ICD-10-CM

## 2022-04-28 ENCOUNTER — Telehealth (HOSPITAL_BASED_OUTPATIENT_CLINIC_OR_DEPARTMENT_OTHER): Payer: Self-pay

## 2022-05-08 ENCOUNTER — Other Ambulatory Visit: Payer: Self-pay | Admitting: Internal Medicine

## 2022-05-08 DIAGNOSIS — M810 Age-related osteoporosis without current pathological fracture: Secondary | ICD-10-CM

## 2022-05-09 ENCOUNTER — Other Ambulatory Visit: Payer: Self-pay | Admitting: Internal Medicine

## 2022-05-09 DIAGNOSIS — Z1231 Encounter for screening mammogram for malignant neoplasm of breast: Secondary | ICD-10-CM

## 2022-06-29 ENCOUNTER — Ambulatory Visit: Payer: Commercial Managed Care - HMO | Admitting: Obstetrics

## 2022-06-29 ENCOUNTER — Ambulatory Visit
Admission: RE | Admit: 2022-06-29 | Discharge: 2022-06-29 | Disposition: A | Discharge status: Home / Self Care | Payer: Commercial Managed Care - HMO | Source: Ambulatory Visit | Attending: Internal Medicine | Admitting: Internal Medicine

## 2022-06-29 DIAGNOSIS — M810 Age-related osteoporosis without current pathological fracture: Secondary | ICD-10-CM

## 2022-06-29 DIAGNOSIS — Z1231 Encounter for screening mammogram for malignant neoplasm of breast: Secondary | ICD-10-CM

## 2022-09-27 ENCOUNTER — Telehealth: Payer: Self-pay

## 2022-09-27 NOTE — Telephone Encounter (Signed)
Received referral from Dr Betsy Coder at [facility] for daytime sleepiness and snoring. Placed in sleep mailbox

## 2022-11-03 IMAGING — MG MM DIGITAL DIAGNOSTIC UNILAT*L* W/ TOMO W/ CAD
4 series · 4 of 12 positions shown · non-contrast
Comparison: Previous exam(s).

CLINICAL DATA: Patient returns after screening study for evaluation
of possible LEFT breast mass.

EXAM:
DIGITAL DIAGNOSTIC UNILATERAL LEFT MAMMOGRAM WITH TOMOSYNTHESIS AND
CAD; ULTRASOUND LEFT BREAST LIMITED
TECHNIQUE: Left digital diagnostic mammography and breast tomosynthesis was
performed. The images were evaluated with computer-aided detection.;
Targeted ultrasound examination of the left breast was performed.

[L CC synth-2D]
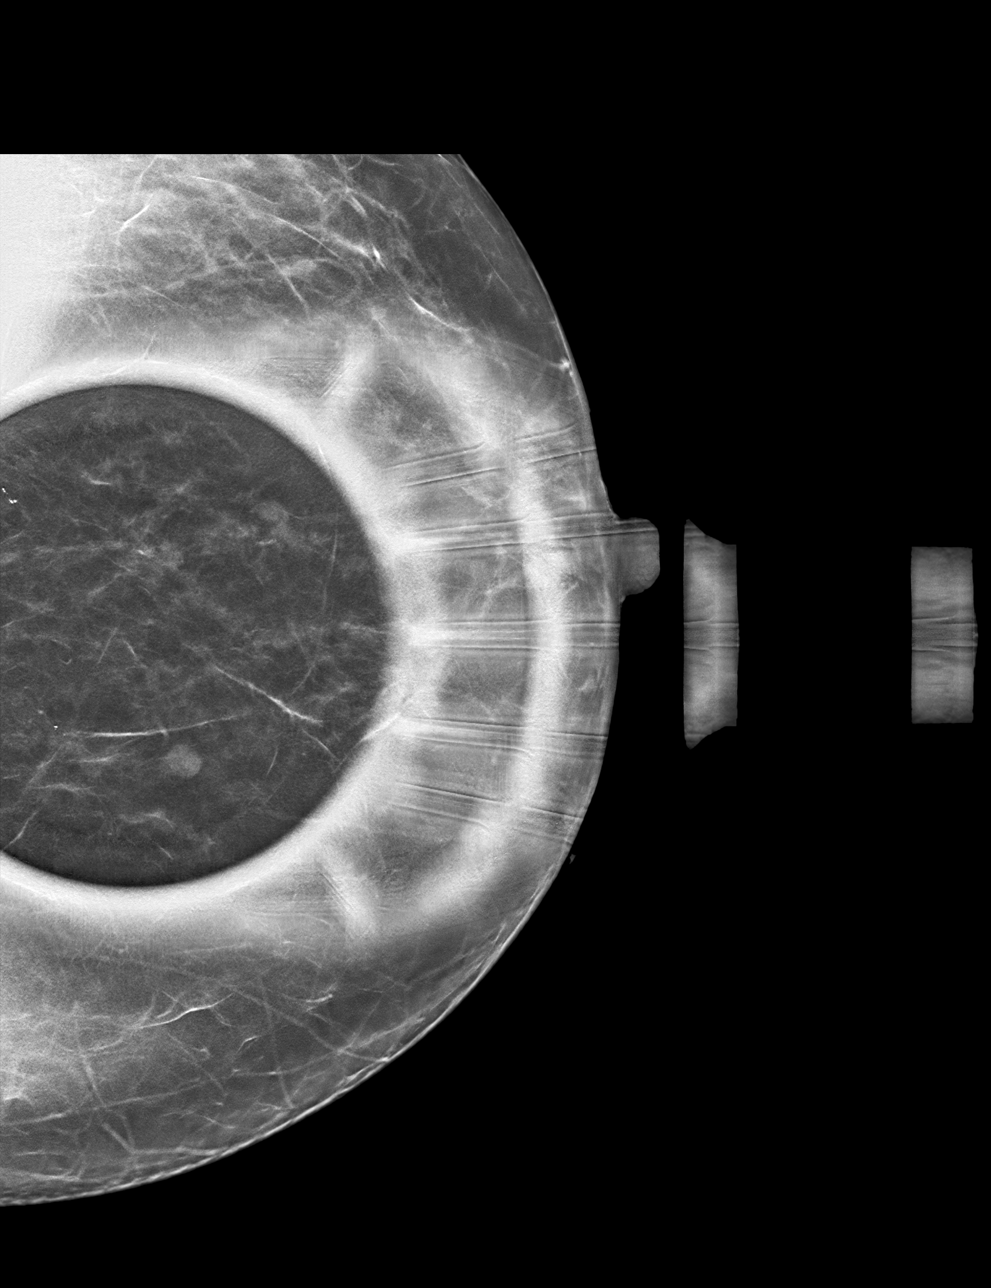

[L MLO synth-2D]
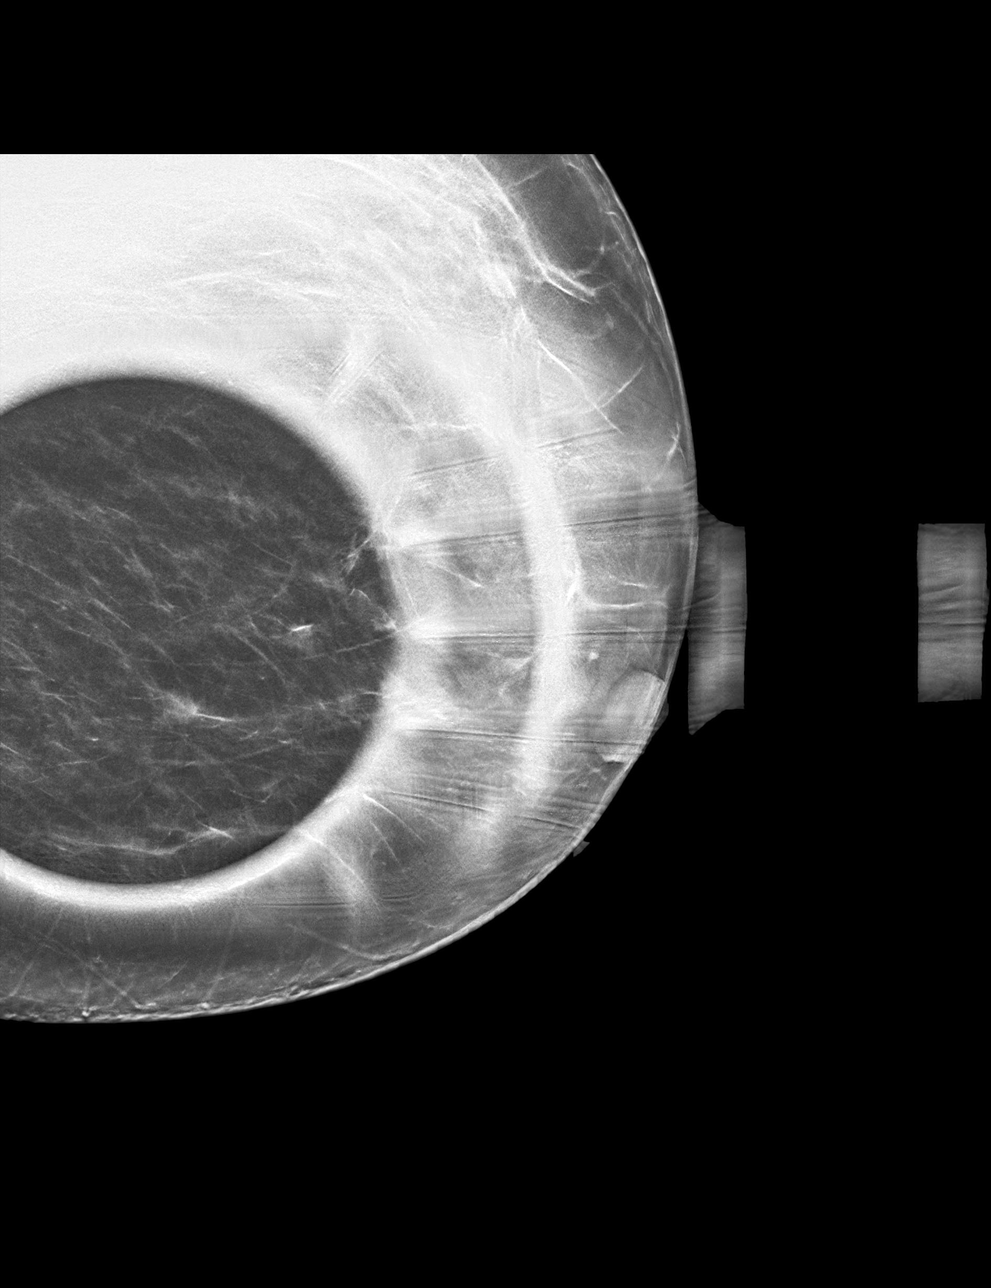

[L MLO tomo · tomo slice 31/61.0]
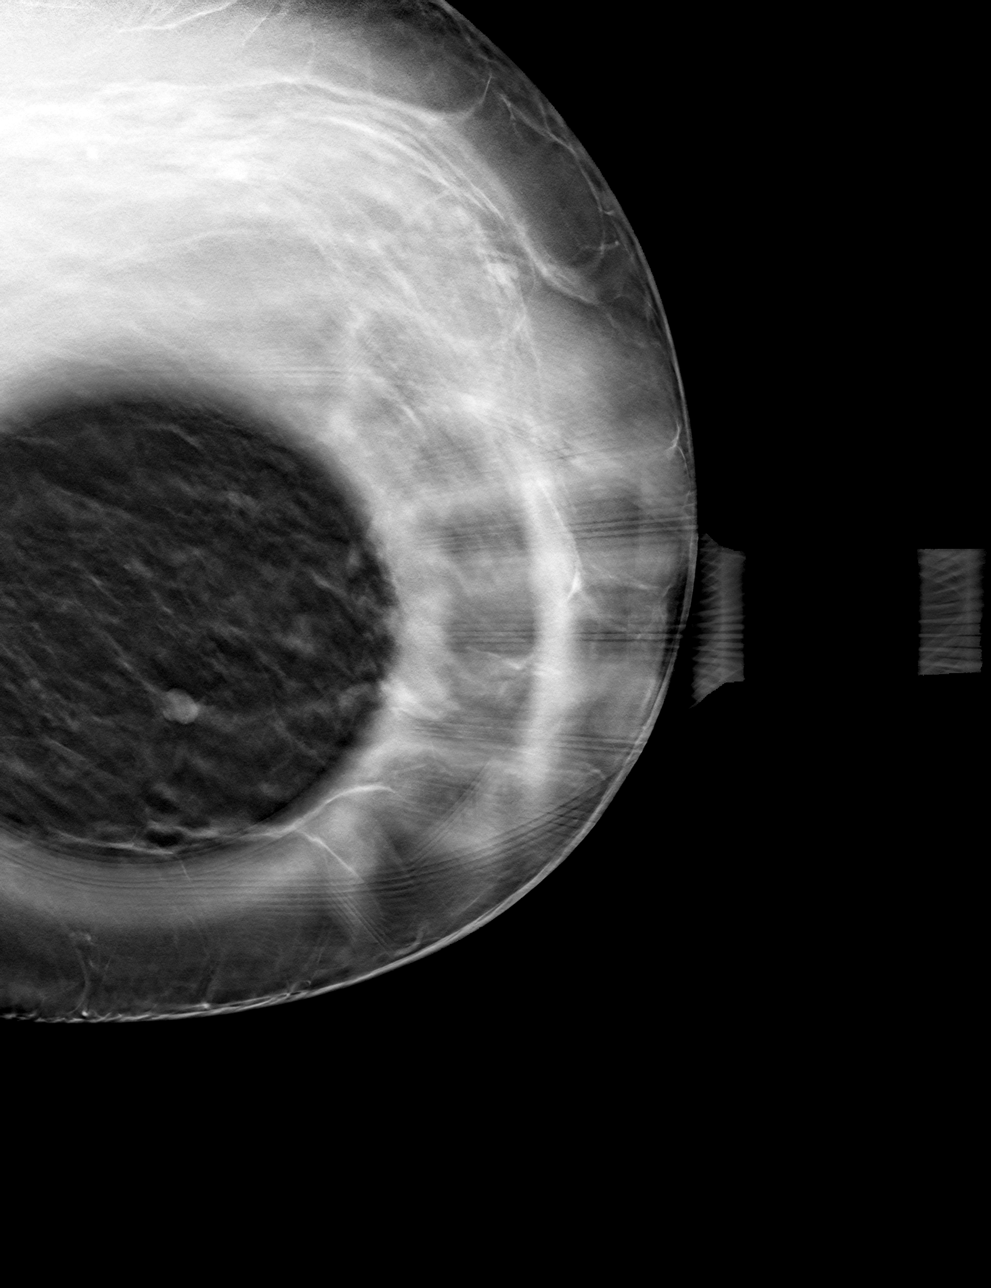

[L CC tomo · tomo slice 29/57.0]
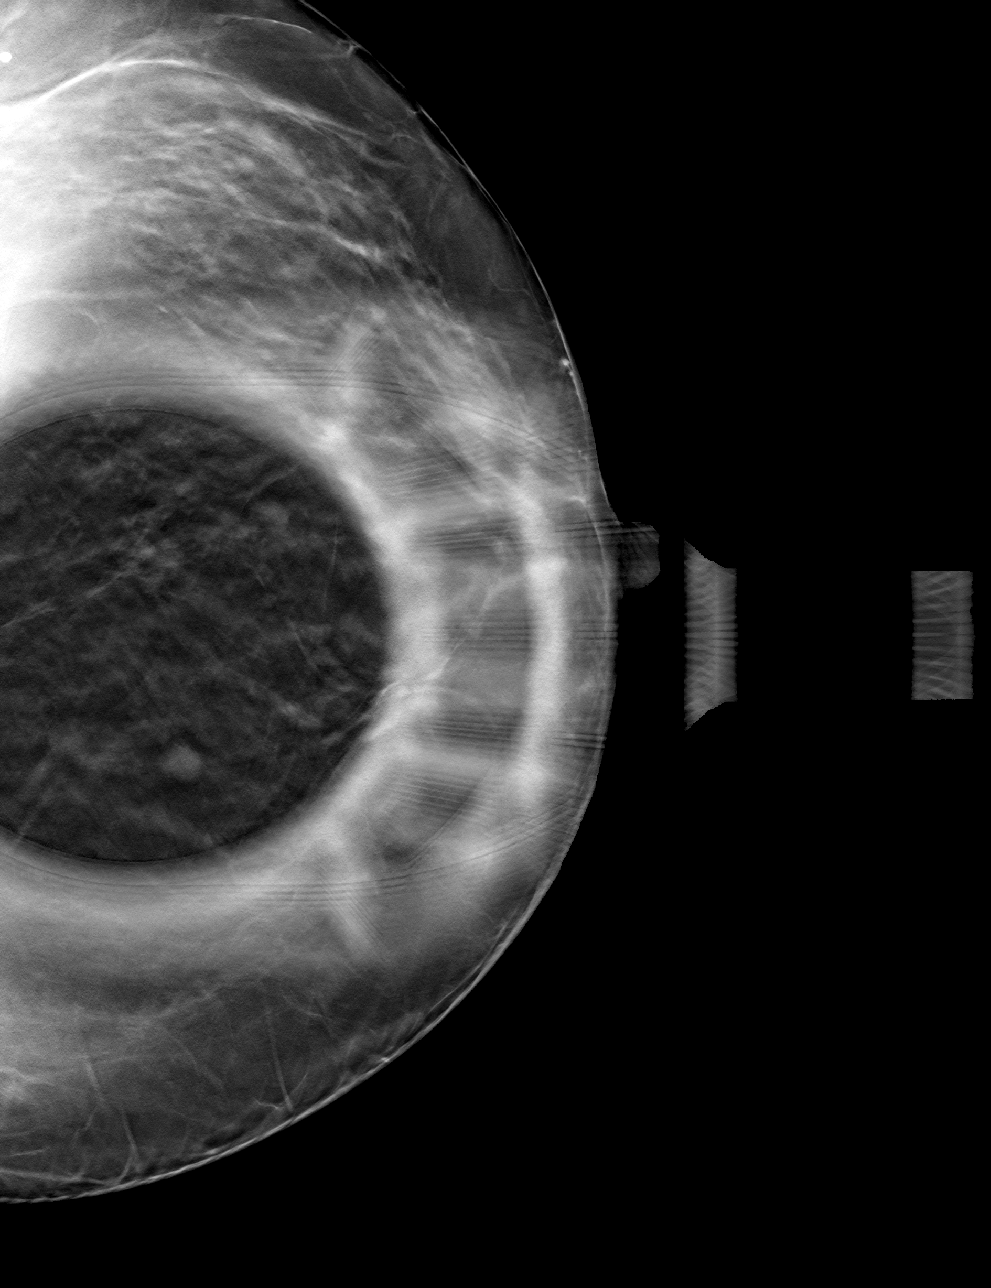

[4 of 12 positions shown; findings below may reference images not displayed]

ACR Breast Density Category b: There are scattered areas of
fibroglandular density.
FINDINGS: Additional 2-D and 3-D images are performed. These views confirm
presence of a circumscribed oval mass in the UPPER INNER QUADRANT of
the LEFT breast.

Targeted ultrasound is performed, showing a simple cyst in the 10
o'clock location of the LEFT breast 4 centimeters from the nipple
measuring 0.6 x 0.4 x 0.5 centimeters. No solid mass or areas of
acoustic shadowing in this portion of the breast.
IMPRESSION: Benign simple cysts in the LEFT breast. No mammographic or
ultrasound evidence for malignancy.

RECOMMENDATION:
Screening mammogram in one year.(Code:[id number])

I have discussed the findings and recommendations with the patient.
If applicable, a reminder letter will be sent to the patient
regarding the next appointment.

BI-RADS CATEGORY  2: Benign.

## 2023-02-17 ENCOUNTER — Other Ambulatory Visit: Payer: Self-pay | Admitting: Internal Medicine

## 2023-02-17 DIAGNOSIS — F321 Major depressive disorder, single episode, moderate: Secondary | ICD-10-CM

## 2023-02-19 NOTE — Telephone Encounter (Signed)
Requested medication (s) are due for refill today: yes  Requested medication (s) are on the active medication list:yes  Last refill:  09/29/21  #90 1 RF  Future visit scheduled: no  Notes to clinic:  Called pt and LM on VM to call back to schedule appt.    Requested Prescriptions  Pending Prescriptions Disp Refills   traZODone (DESYREL) 50 MG tablet [Pharmacy Med Name: TRAZODONE 50MG  TABLETS] 30 tablet     Sig: TAKE 1 TABLET(50 MG) BY MOUTH AT BEDTIME     Psychiatry: Antidepressants - Serotonin Modulator Failed - 02/17/2023 12:36 PM      Failed - Completed PHQ-2 or PHQ-9 in the last 360 days      Failed - Valid encounter within last 6 months    Recent Outpatient Visits           1 year ago Encounter for screening for diabetes mellitus   [facility] [place] [place], [place]   1 year ago Need for immunization against influenza   [facility] Hoy Register, MD   2 years ago Essential hypertension   [facility] Marcine Matar, MD

## 2023-04-02 ENCOUNTER — Encounter (HOSPITAL_BASED_OUTPATIENT_CLINIC_OR_DEPARTMENT_OTHER): Payer: Self-pay | Admitting: Pulmonary Disease

## 2023-04-02 ENCOUNTER — Encounter (HOSPITAL_BASED_OUTPATIENT_CLINIC_OR_DEPARTMENT_OTHER): Payer: Self-pay

## 2023-06-12 ENCOUNTER — Other Ambulatory Visit: Payer: Self-pay | Admitting: Internal Medicine

## 2023-06-12 DIAGNOSIS — Z1231 Encounter for screening mammogram for malignant neoplasm of breast: Secondary | ICD-10-CM

## 2023-07-17 ENCOUNTER — Ambulatory Visit
Admission: RE | Admit: 2023-07-17 | Discharge: 2023-07-17 | Disposition: A | Discharge status: Home / Self Care | Payer: Commercial Managed Care - HMO | Source: Ambulatory Visit | Attending: Internal Medicine | Admitting: Internal Medicine

## 2023-07-17 DIAGNOSIS — Z1231 Encounter for screening mammogram for malignant neoplasm of breast: Secondary | ICD-10-CM

## 2024-07-12 ENCOUNTER — Encounter (HOSPITAL_BASED_OUTPATIENT_CLINIC_OR_DEPARTMENT_OTHER): Admitting: Radiology

## 2024-07-12 DIAGNOSIS — Z1231 Encounter for screening mammogram for malignant neoplasm of breast: Secondary | ICD-10-CM
